# Patient Record
Sex: Female | Born: 1965 | Hispanic: No | Marital: Married | Smoking: Never smoker
Health system: Southern US, Community
[De-identification: ages and names within clinical notes are randomized; demographics above are authoritative.]

## PROBLEM LIST (undated history)

## (undated) DIAGNOSIS — M1990 Unspecified osteoarthritis, unspecified site: Secondary | ICD-10-CM

## (undated) DIAGNOSIS — G43909 Migraine, unspecified, not intractable, without status migrainosus: Secondary | ICD-10-CM

## (undated) DIAGNOSIS — J309 Allergic rhinitis, unspecified: Secondary | ICD-10-CM

## (undated) DIAGNOSIS — K219 Gastro-esophageal reflux disease without esophagitis: Secondary | ICD-10-CM

## (undated) HISTORY — DX: Migraine, unspecified, not intractable, without status migrainosus: G43.909

## (undated) HISTORY — DX: Allergic rhinitis, unspecified: J30.9

## (undated) HISTORY — DX: Gastro-esophageal reflux disease without esophagitis: K21.9

## (undated) HISTORY — DX: Unspecified osteoarthritis, unspecified site: M19.90

---

## 1998-01-24 ENCOUNTER — Other Ambulatory Visit: Admission: RE | Admit: 1998-01-24 | Discharge: 1998-01-24 | Payer: Self-pay | Admitting: Gynecology

## 1999-02-18 ENCOUNTER — Other Ambulatory Visit: Admission: RE | Admit: 1999-02-18 | Discharge: 1999-02-18 | Payer: Self-pay | Admitting: Gynecology

## 2000-04-22 ENCOUNTER — Other Ambulatory Visit: Admission: RE | Admit: 2000-04-22 | Discharge: 2000-04-22 | Payer: Self-pay | Admitting: Gynecology

## 2001-05-10 ENCOUNTER — Other Ambulatory Visit: Admission: RE | Admit: 2001-05-10 | Discharge: 2001-05-10 | Payer: Self-pay | Admitting: Gynecology

## 2002-07-06 ENCOUNTER — Other Ambulatory Visit: Admission: RE | Admit: 2002-07-06 | Discharge: 2002-07-06 | Payer: Self-pay | Admitting: Gynecology

## 2003-07-10 ENCOUNTER — Other Ambulatory Visit: Admission: RE | Admit: 2003-07-10 | Discharge: 2003-07-10 | Payer: Self-pay | Admitting: Gynecology

## 2004-08-12 ENCOUNTER — Other Ambulatory Visit: Admission: RE | Admit: 2004-08-12 | Discharge: 2004-08-12 | Payer: Self-pay | Admitting: Gynecology

## 2005-08-19 ENCOUNTER — Other Ambulatory Visit: Admission: RE | Admit: 2005-08-19 | Discharge: 2005-08-19 | Payer: Self-pay | Admitting: Gynecology

## 2011-05-13 HISTORY — PX: CHOLECYSTECTOMY: SHX55

## 2015-10-02 ENCOUNTER — Ambulatory Visit (INDEPENDENT_AMBULATORY_CARE_PROVIDER_SITE_OTHER): Payer: BLUE CROSS/BLUE SHIELD | Admitting: Sports Medicine

## 2015-10-02 ENCOUNTER — Ambulatory Visit (INDEPENDENT_AMBULATORY_CARE_PROVIDER_SITE_OTHER): Payer: BLUE CROSS/BLUE SHIELD

## 2015-10-02 ENCOUNTER — Encounter: Payer: Self-pay | Admitting: Sports Medicine

## 2015-10-02 DIAGNOSIS — M79673 Pain in unspecified foot: Secondary | ICD-10-CM

## 2015-10-02 DIAGNOSIS — R252 Cramp and spasm: Secondary | ICD-10-CM

## 2015-10-02 DIAGNOSIS — M722 Plantar fascial fibromatosis: Secondary | ICD-10-CM

## 2015-10-02 NOTE — Progress Notes (Signed)
Patient ID: Cathy Whitaker, female   DOB: 28-Jul-1966, 49 y.o.   MRN: [id number] Subjective: Cathy Whitaker is a 49 y.o. female patient presents to office with complaint of heel pain on the right and occasional night spams on left. Patient admits to post static dyskinesia for 1.5  months in duration with burning to toes; ranks 4/10 sometimes. Patient has treated this problem with topical cream and night splint of her husband which helps.  Patient reports that she also sometimes have cramps in foot on left at night that wakes up her husband and lasts for a few minutes made better with massage or walking. Denies any other pedal complaints.   Review of Systems  All other systems reviewed and are negative.  There are no active problems to display for this patient.  No current outpatient prescriptions on file prior to visit.   No current facility-administered medications on file prior to visit.   No Known Allergies   Objective: Physical Exam General: The patient is alert and oriented x3 in no acute distress.  Dermatology: Skin is warm, dry and supple bilateral lower extremities. Nails 1-10 are normal. There is no erythema, edema, no eccymosis, no open lesions present. Integument is otherwise unremarkable.  Vascular: Dorsalis Pedis pulse and Posterior Tibial pulse are 2/4 bilateral. Capillary fill time is immediate to all digits.  Neurological: Grossly intact to light touch with an achilles reflex of +2/5 and a negative Tinel's sign bilateral.  Musculoskeletal: Mild Tenderness to palpation at the medial calcaneal tubercale and through the insertion of the plantar fascia on the right foot. No pain with palpation to left. No active cramping or spasms. No pain with compression of calcaneus bilateral. No pain with tuning fork to calcaneus bilateral. No pain with calf compression bilateral. Ankle and pedal range of motion within normal limits bilateral. Strength 5/5 in all groups bilateral. Cavus foot  type.   Xray, Right & Left foot: 3 Views Normal osseous mineralization. Joint spaces preserved. No fracture/dislocation/boney destruction. Mild 4-5 hammertoe, Minimal Calcaneal spur present with mild thickening of plantar fascia on right, none on left. Pes Cavus foot type. No other soft tissue abnormalities or radiopaque foreign bodies.   Assessment and Plan: Problem List Items Addressed This Visit    None    Visit Diagnoses    Foot pain, unspecified laterality    -  Primary    Relevant Orders    DG Foot Complete Left    DG Foot Complete Right    Plantar fasciitis of right foot        Nocturnal foot cramps        Left, ocassional      -Complete examination performed. Discussed with patient in detail the condition of plantar fasciitis, how this occurs and general treatment options. Explained both conservative and surgical treatments.  -No injection or medicine given at this time due to minimal symptoms on right -Recommended good supportive shoes and advised use of OTC insert. Explained to patient that if these orthoses work well, we will continue with these. If these do not improve her condition and  pain, we will consider custom molded orthoses. -Explained in detail the use of the fascial brace which was dispensed at today's visit. -Cont with night splint daily of which already owns. -Explained and dispensed to patient daily stretching exercises. -Recommend patient to ice affected area 1-2x daily. Ice pack given.  -Recommend tonic water for nocturnal cramps; will monitor closely  -Patient to return to office in  4 weeks for follow up or sooner if problems or questions. Arise.  Asencion Islamitorya Kenzee Bassin, DPM

## 2015-10-02 NOTE — Patient Instructions (Signed)

## 2015-10-02 NOTE — Progress Notes (Deleted)
   Subjective:    Patient ID: Cathy HesselbachPaula S Sjogren, female    DOB: Nov 22, 1965, 49 y.o.   MRN: [id number]  HPI    Review of Systems  All other systems reviewed and are negative.      Objective:   Physical Exam        Assessment & Plan:

## 2015-10-30 ENCOUNTER — Ambulatory Visit (INDEPENDENT_AMBULATORY_CARE_PROVIDER_SITE_OTHER): Payer: BLUE CROSS/BLUE SHIELD | Admitting: Sports Medicine

## 2015-10-30 ENCOUNTER — Encounter: Payer: Self-pay | Admitting: Sports Medicine

## 2015-10-30 DIAGNOSIS — M79673 Pain in unspecified foot: Secondary | ICD-10-CM | POA: Diagnosis not present

## 2015-10-30 DIAGNOSIS — R252 Cramp and spasm: Secondary | ICD-10-CM | POA: Diagnosis not present

## 2015-10-30 DIAGNOSIS — M722 Plantar fascial fibromatosis: Secondary | ICD-10-CM | POA: Diagnosis not present

## 2015-10-30 NOTE — Progress Notes (Signed)
Patient ID: Cathy Whitaker, female   DOB: 25-Mar-1966, 50 y.o.   MRN: [id number]  Subjective: Cathy Whitaker is a 50 y.o. female patient returns to office with for follow up of right heel pain and occasional night spams on left. Patient states that pain is completely resolved. Denies any other pedal complaints.   There are no active problems to display for this patient.  Current Outpatient Prescriptions on File Prior to Visit  Medication Sig Dispense Refill  . doxycycline (PERIOSTAT) 20 MG tablet Take 20 mg by mouth 2 (two) times daily with a meal.  5  . fexofenadine-pseudoephedrine (ALLEGRA-D 24) 180-240 MG 24 hr tablet Take 1 tablet by mouth daily.    . metroNIDAZOLE (METROCREAM) 0.75 % cream Apply topically 2 (two) times daily. to face  4  . SUMAtriptan (IMITREX) 100 MG tablet Take 100 mg by mouth as needed.  12  . valACYclovir (VALTREX) 1000 MG tablet TAKE ONE TABLET THREE TIMES DAILY FOR 7 DAYS  5   No current facility-administered medications on file prior to visit.   No Known Allergies   Objective: Physical Exam General: The patient is alert and oriented x3 in no acute distress.  Dermatology: Skin is warm, dry and supple bilateral lower extremities. Nails 1-10 are normal. There is no erythema, edema, no eccymosis, no open lesions present. Integument is otherwise unremarkable.  Vascular: Dorsalis Pedis pulse and Posterior Tibial pulse are 2/4 bilateral. Capillary fill time is immediate to all digits.  Neurological: Grossly intact to light touch with an achilles reflex of +2/5 and a negative Tinel's sign bilateral.  Musculoskeletal: No Tenderness to palpation at the medial calcaneal tubercale and through the insertion of the plantar fascia on the right foot. No pain with palpation to left. No active cramping or spasms. No pain with compression of calcaneus bilateral. No pain with tuning fork to calcaneus bilateral. No pain with calf compression bilateral. Ankle and pedal range of  motion within normal limits bilateral. Strength 5/5 in all groups bilateral. Cavus foot type.   Assessment and Plan: Problem List Items Addressed This Visit    None    Visit Diagnoses    Foot pain, unspecified laterality    -  Primary    Plantar fasciitis of right foot        Improved    Nocturnal foot cramps        Ocassional      -Complete examination performed.  -Discussed long term plan of care -Recommended cont with  good supportive shoes and advised use of OTC insert. Will consider custom orthotics at next encounter. -Patient to wean from fascial brace and night splint -Cont with stretching and icing as needed especially after exercise/activitiy  -Cont monitoring leg cramps -Patient to return to office in 4-6 weeks for follow up or sooner if problems or questions arise.  Asencion Islam, DPM

## 2015-12-11 ENCOUNTER — Ambulatory Visit: Payer: BLUE CROSS/BLUE SHIELD | Admitting: Sports Medicine

## 2016-01-08 ENCOUNTER — Ambulatory Visit (INDEPENDENT_AMBULATORY_CARE_PROVIDER_SITE_OTHER): Payer: BLUE CROSS/BLUE SHIELD | Admitting: Sports Medicine

## 2016-01-08 ENCOUNTER — Encounter: Payer: Self-pay | Admitting: Sports Medicine

## 2016-01-08 DIAGNOSIS — M79673 Pain in unspecified foot: Secondary | ICD-10-CM

## 2016-01-08 DIAGNOSIS — R252 Cramp and spasm: Secondary | ICD-10-CM

## 2016-01-08 DIAGNOSIS — M7741 Metatarsalgia, right foot: Secondary | ICD-10-CM | POA: Diagnosis not present

## 2016-01-08 DIAGNOSIS — M722 Plantar fascial fibromatosis: Secondary | ICD-10-CM | POA: Diagnosis not present

## 2016-01-08 NOTE — Progress Notes (Signed)
Patient ID: Cathy Whitaker, female   DOB: 1965/10/13, 50 y.o.   MRN: [id number]  Subjective: Cathy Whitaker is a 50 y.o. female patient returns to office with for follow up of right heel pain; states that she is doing well with no re-exacerbation in symptoms; every now and then she has a little pain with first few steps out of bed in the morning but has been doing stretches to help prevent the pain from worsening, Patient states that leg cramps are better; maybe 1 episode in the last 3 months; patient sates that she has a new pain at the ball of her right foot that feels like a burning sensation with thickening of the skin at site; relieved with use of silicone pad. Denies any other pedal complaints.   There are no active problems to display for this patient.  Current Outpatient Prescriptions on File Prior to Visit  Medication Sig Dispense Refill  . doxycycline (PERIOSTAT) 20 MG tablet Take 20 mg by mouth 2 (two) times daily with a meal.  5  . fexofenadine-pseudoephedrine (ALLEGRA-D 24) 180-240 MG 24 hr tablet Take 1 tablet by mouth daily.    . metroNIDAZOLE (METROCREAM) 0.75 % cream Apply topically 2 (two) times daily. to face  4  . SUMAtriptan (IMITREX) 100 MG tablet Take 100 mg by mouth as needed.  12  . valACYclovir (VALTREX) 1000 MG tablet TAKE ONE TABLET THREE TIMES DAILY FOR 7 DAYS  5   No current facility-administered medications on file prior to visit.   No Known Allergies   Objective: Physical Exam General: The patient is alert and oriented x3 in no acute distress.  Dermatology: Skin is warm, dry and supple bilateral lower extremities. Nails 1-10 are normal. There is no erythema, edema, no eccymosis, no open lesions present. No hyperkeratotic tissue. Integument is otherwise unremarkable.  Vascular: Dorsalis Pedis pulse and Posterior Tibial pulse are 2/4 bilateral. Capillary fill time is immediate to all digits.  Neurological: Grossly intact to light touch with an achilles reflex  of +2/5 and a negative Tinel's sign bilateral.  Musculoskeletal: No reproducible pain at the ball of the right foot with palpation. No Tenderness to palpation at the medial calcaneal tubercale and through the insertion of the plantar fascia on the right foot. No pain with palpation to left. No active cramping or spasms. No pain with compression of calcaneus bilateral. No pain with calf compression bilateral. Ankle and pedal range of motion within normal limits bilateral. Strength 5/5 in all groups bilateral. Cavus foot type.   Assessment and Plan: Problem List Items Addressed This Visit    None    Visit Diagnoses    Plantar fasciitis of right foot    -  Primary    Metatarsalgia of right foot        Foot pain, unspecified laterality        Nocturnal foot cramps        Ocassional      -Complete examination performed.  -Discussed long term plan of care for plantar fasciitis and metatarsalgia in relations to her foot type -Recommended cont with  good supportive shoes  -Recommended custom functional foot orthotics; scanned patient today for full length orthotic with met pad acommodation with deep heel cup no post and pearl cow top cover.  -Gave patient felt met pad to use at ball of right foot and instructed patient on use. Patient may also use the silicone one that she has until her custom orthotics arrive -Cont with  stretching and icing as needed especially after exercise/activitiy to prevent worsening or recurrence of heel pain. Advised patient if returns to ice, wear fascial brace, and return to office for eval -Cont monitoring leg cramps if worsen will further discuss treatment options  -Patient to return to office for pick up orthotics or sooner if problems or questions arise.  Landis Martins, DPM

## 2016-02-19 ENCOUNTER — Ambulatory Visit: Payer: BLUE CROSS/BLUE SHIELD | Admitting: Sports Medicine

## 2016-02-19 DIAGNOSIS — M79673 Pain in unspecified foot: Secondary | ICD-10-CM

## 2016-04-01 ENCOUNTER — Ambulatory Visit: Payer: BLUE CROSS/BLUE SHIELD | Admitting: Sports Medicine

## 2016-04-02 NOTE — Patient Instructions (Signed)

## 2016-04-02 NOTE — Progress Notes (Signed)
Patient ID: Cathy Whitaker, female   DOB: July 17, 1966, 50 y.o.   MRN: [id number] [facility]  Verbal and written break in and wear instructions given.  Patient will follow up in 4 weeks if symptoms worsen or fail to improve.

## 2016-06-10 DIAGNOSIS — Z6820 Body mass index (BMI) 20.0-20.9, adult: Secondary | ICD-10-CM | POA: Diagnosis not present

## 2016-06-10 DIAGNOSIS — Z01419 Encounter for gynecological examination (general) (routine) without abnormal findings: Secondary | ICD-10-CM | POA: Diagnosis not present

## 2016-06-10 DIAGNOSIS — Z1231 Encounter for screening mammogram for malignant neoplasm of breast: Secondary | ICD-10-CM | POA: Diagnosis not present

## 2016-07-02 DIAGNOSIS — G43909 Migraine, unspecified, not intractable, without status migrainosus: Secondary | ICD-10-CM | POA: Diagnosis not present

## 2016-07-02 DIAGNOSIS — Z1389 Encounter for screening for other disorder: Secondary | ICD-10-CM | POA: Diagnosis not present

## 2016-07-03 DIAGNOSIS — Z0000 Encounter for general adult medical examination without abnormal findings: Secondary | ICD-10-CM | POA: Diagnosis not present

## 2016-07-17 DIAGNOSIS — B009 Herpesviral infection, unspecified: Secondary | ICD-10-CM | POA: Diagnosis not present

## 2016-07-17 DIAGNOSIS — D225 Melanocytic nevi of trunk: Secondary | ICD-10-CM | POA: Diagnosis not present

## 2016-07-17 DIAGNOSIS — D2239 Melanocytic nevi of other parts of face: Secondary | ICD-10-CM | POA: Diagnosis not present

## 2016-10-01 DIAGNOSIS — J0100 Acute maxillary sinusitis, unspecified: Secondary | ICD-10-CM | POA: Diagnosis not present

## 2016-11-02 DIAGNOSIS — D125 Benign neoplasm of sigmoid colon: Secondary | ICD-10-CM | POA: Diagnosis not present

## 2016-11-02 DIAGNOSIS — Z1211 Encounter for screening for malignant neoplasm of colon: Secondary | ICD-10-CM | POA: Diagnosis not present

## 2016-11-12 DIAGNOSIS — F329 Major depressive disorder, single episode, unspecified: Secondary | ICD-10-CM | POA: Diagnosis not present

## 2017-06-24 DIAGNOSIS — Z124 Encounter for screening for malignant neoplasm of cervix: Secondary | ICD-10-CM | POA: Diagnosis not present

## 2017-06-24 DIAGNOSIS — Z01419 Encounter for gynecological examination (general) (routine) without abnormal findings: Secondary | ICD-10-CM | POA: Diagnosis not present

## 2017-06-24 DIAGNOSIS — Z1231 Encounter for screening mammogram for malignant neoplasm of breast: Secondary | ICD-10-CM | POA: Diagnosis not present

## 2017-07-12 DIAGNOSIS — D225 Melanocytic nevi of trunk: Secondary | ICD-10-CM | POA: Diagnosis not present

## 2017-07-12 DIAGNOSIS — L821 Other seborrheic keratosis: Secondary | ICD-10-CM | POA: Diagnosis not present

## 2017-07-12 DIAGNOSIS — D2239 Melanocytic nevi of other parts of face: Secondary | ICD-10-CM | POA: Diagnosis not present

## 2017-07-12 DIAGNOSIS — L814 Other melanin hyperpigmentation: Secondary | ICD-10-CM | POA: Diagnosis not present

## 2017-07-12 DIAGNOSIS — D485 Neoplasm of uncertain behavior of skin: Secondary | ICD-10-CM | POA: Diagnosis not present

## 2017-11-05 DIAGNOSIS — M545 Low back pain: Secondary | ICD-10-CM | POA: Diagnosis not present

## 2017-11-05 DIAGNOSIS — M419 Scoliosis, unspecified: Secondary | ICD-10-CM | POA: Diagnosis not present

## 2017-11-05 DIAGNOSIS — Z6820 Body mass index (BMI) 20.0-20.9, adult: Secondary | ICD-10-CM | POA: Diagnosis not present

## 2017-12-02 DIAGNOSIS — G43909 Migraine, unspecified, not intractable, without status migrainosus: Secondary | ICD-10-CM | POA: Diagnosis not present

## 2017-12-02 DIAGNOSIS — Z681 Body mass index (BMI) 19 or less, adult: Secondary | ICD-10-CM | POA: Diagnosis not present

## 2018-01-10 DIAGNOSIS — L601 Onycholysis: Secondary | ICD-10-CM | POA: Diagnosis not present

## 2018-02-21 DIAGNOSIS — L601 Onycholysis: Secondary | ICD-10-CM | POA: Diagnosis not present

## 2018-03-17 DIAGNOSIS — H1013 Acute atopic conjunctivitis, bilateral: Secondary | ICD-10-CM | POA: Diagnosis not present

## 2018-03-17 DIAGNOSIS — J309 Allergic rhinitis, unspecified: Secondary | ICD-10-CM | POA: Diagnosis not present

## 2018-03-17 DIAGNOSIS — Z681 Body mass index (BMI) 19 or less, adult: Secondary | ICD-10-CM | POA: Diagnosis not present

## 2018-04-18 DIAGNOSIS — L601 Onycholysis: Secondary | ICD-10-CM | POA: Diagnosis not present

## 2018-06-20 DIAGNOSIS — J309 Allergic rhinitis, unspecified: Secondary | ICD-10-CM | POA: Diagnosis not present

## 2018-06-20 DIAGNOSIS — H6980 Other specified disorders of Eustachian tube, unspecified ear: Secondary | ICD-10-CM | POA: Diagnosis not present

## 2018-06-20 DIAGNOSIS — Z681 Body mass index (BMI) 19 or less, adult: Secondary | ICD-10-CM | POA: Diagnosis not present

## 2018-06-27 DIAGNOSIS — Z124 Encounter for screening for malignant neoplasm of cervix: Secondary | ICD-10-CM | POA: Diagnosis not present

## 2018-06-27 DIAGNOSIS — Z1231 Encounter for screening mammogram for malignant neoplasm of breast: Secondary | ICD-10-CM | POA: Diagnosis not present

## 2018-06-27 DIAGNOSIS — Z01419 Encounter for gynecological examination (general) (routine) without abnormal findings: Secondary | ICD-10-CM | POA: Diagnosis not present

## 2018-06-27 DIAGNOSIS — Z681 Body mass index (BMI) 19 or less, adult: Secondary | ICD-10-CM | POA: Diagnosis not present

## 2018-06-30 DIAGNOSIS — H9120 Sudden idiopathic hearing loss, unspecified ear: Secondary | ICD-10-CM | POA: Diagnosis not present

## 2018-06-30 DIAGNOSIS — H9191 Unspecified hearing loss, right ear: Secondary | ICD-10-CM | POA: Diagnosis not present

## 2018-06-30 DIAGNOSIS — H9042 Sensorineural hearing loss, unilateral, left ear, with unrestricted hearing on the contralateral side: Secondary | ICD-10-CM | POA: Diagnosis not present

## 2018-07-05 ENCOUNTER — Other Ambulatory Visit: Payer: Self-pay | Admitting: Physician Assistant

## 2018-07-05 DIAGNOSIS — H9120 Sudden idiopathic hearing loss, unspecified ear: Secondary | ICD-10-CM

## 2018-07-10 ENCOUNTER — Ambulatory Visit
Admission: RE | Admit: 2018-07-10 | Discharge: 2018-07-10 | Disposition: A | Discharge status: Home / Self Care | Payer: BLUE CROSS/BLUE SHIELD | Source: Ambulatory Visit | Attending: Physician Assistant | Admitting: Physician Assistant

## 2018-07-10 DIAGNOSIS — H9123 Sudden idiopathic hearing loss, bilateral: Secondary | ICD-10-CM | POA: Diagnosis not present

## 2018-07-10 DIAGNOSIS — H9120 Sudden idiopathic hearing loss, unspecified ear: Secondary | ICD-10-CM

## 2018-07-10 MED ORDER — GADOBENATE DIMEGLUMINE 529 MG/ML IV SOLN
10.0000 mL | Freq: Once | INTRAVENOUS | Status: AC | PRN
  Administered 2018-07-10: 10 mL via INTRAVENOUS

## 2018-07-12 DIAGNOSIS — L820 Inflamed seborrheic keratosis: Secondary | ICD-10-CM | POA: Diagnosis not present

## 2018-07-12 DIAGNOSIS — D225 Melanocytic nevi of trunk: Secondary | ICD-10-CM | POA: Diagnosis not present

## 2018-07-12 DIAGNOSIS — L821 Other seborrheic keratosis: Secondary | ICD-10-CM | POA: Diagnosis not present

## 2018-07-12 DIAGNOSIS — D2239 Melanocytic nevi of other parts of face: Secondary | ICD-10-CM | POA: Diagnosis not present

## 2018-07-12 DIAGNOSIS — B351 Tinea unguium: Secondary | ICD-10-CM | POA: Diagnosis not present

## 2018-07-14 DIAGNOSIS — H905 Unspecified sensorineural hearing loss: Secondary | ICD-10-CM | POA: Diagnosis not present

## 2018-07-14 DIAGNOSIS — H9122 Sudden idiopathic hearing loss, left ear: Secondary | ICD-10-CM | POA: Diagnosis not present

## 2018-09-07 DIAGNOSIS — Z681 Body mass index (BMI) 19 or less, adult: Secondary | ICD-10-CM | POA: Diagnosis not present

## 2018-09-07 DIAGNOSIS — H10509 Unspecified blepharoconjunctivitis, unspecified eye: Secondary | ICD-10-CM | POA: Diagnosis not present

## 2018-09-18 DIAGNOSIS — K529 Noninfective gastroenteritis and colitis, unspecified: Secondary | ICD-10-CM | POA: Diagnosis not present

## 2018-09-18 DIAGNOSIS — J069 Acute upper respiratory infection, unspecified: Secondary | ICD-10-CM | POA: Diagnosis not present

## 2018-10-10 DIAGNOSIS — B351 Tinea unguium: Secondary | ICD-10-CM | POA: Diagnosis not present

## 2018-10-10 DIAGNOSIS — B009 Herpesviral infection, unspecified: Secondary | ICD-10-CM | POA: Diagnosis not present

## 2018-10-21 IMAGING — MR MR HEAD WO/W CM
11 of 12 series · 43 of 48 positions shown · IV contrast (multihance)
Comparison: None.

CLINICAL DATA: Hearing loss on the left, 3-4 weeks duration. Sudden
sensorineural hearing loss.

EXAM:
MRI HEAD WITHOUT AND WITH CONTRAST
TECHNIQUE: Multiplanar, multiecho pulse sequences of the brain and surrounding
structures were obtained without and with intravenous contrast.
CONTRAST:  10mL MULTIHANCE GADOBENATE DIMEGLUMINE 529 MG/ML IV SOLN

[Series 5: T1 · sagittal · 4.0mm · 0.72mm/px · 3 of 27 slices shown (1 of 3)]
[im 1/27]
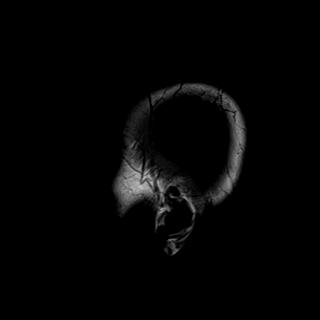
[im 14/27]
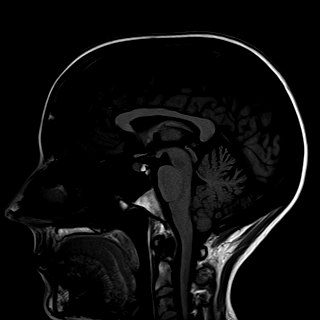
[im 27/27]
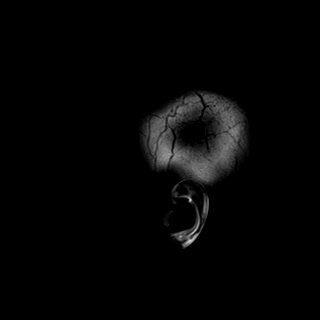

[Series 6: DWI · axial · 3.0mm · 1.44mm/px · z∈[-100,+63]mm · 8 of 85 slices shown (1 of 2)]
[im 1/85]
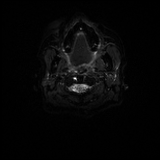
[im 13/85]
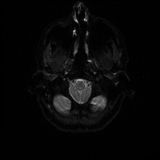
[im 25/85]
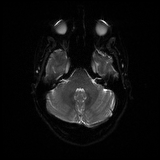
[im 37/85]
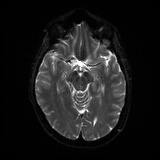
[im 49/85]
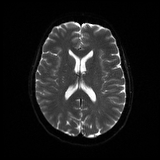
[im 61/85]
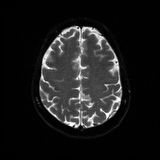
[im 73/85]
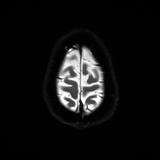
[im 85/85]
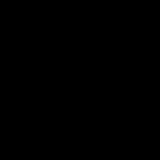

[Series 7: DWI · axial · 3.0mm · 1.44mm/px · z∈[-100,+59]mm · 3 of 42 slices shown (2 of 2)]
[im 1/42]
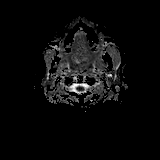
[im 21/42]
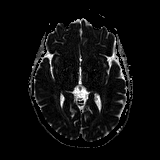
[im 42/42]
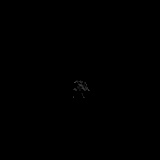

[Series 8: T2 · axial · 4.0mm · 0.36mm/px · z∈[-87,+47]mm · 2 of 27 slices shown]
[im 1/27]
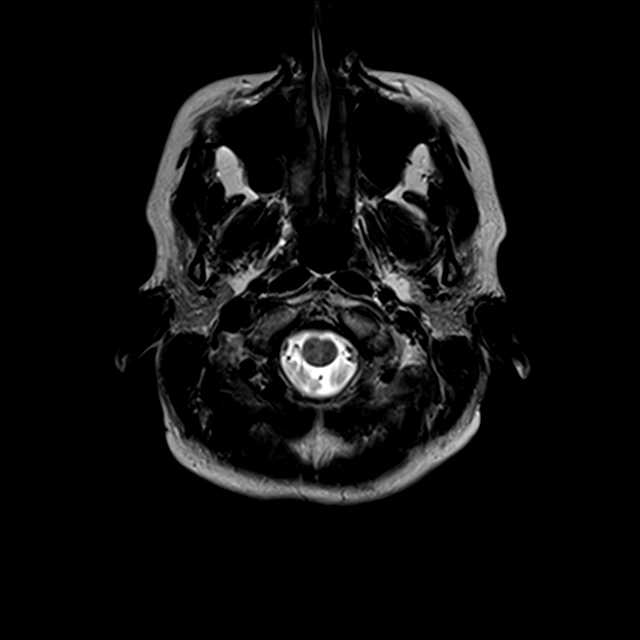
[im 27/27]
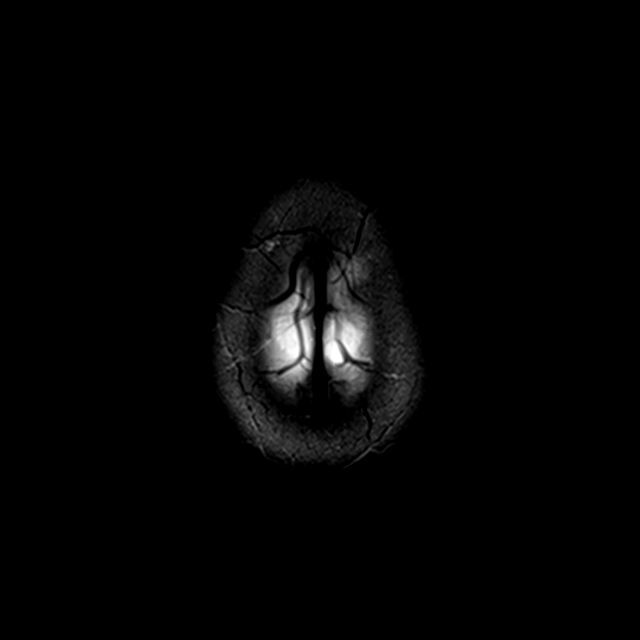

[Series 9: FLAIR · axial · 3.0mm · 0.72mm/px · z∈[-100,+61]mm · 2 of 28 slices shown]
[im 1/28]
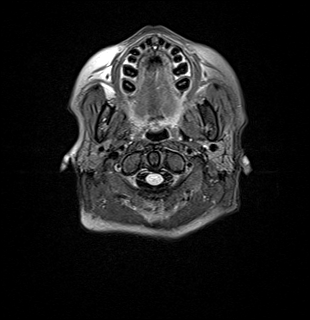
[im 28/28]
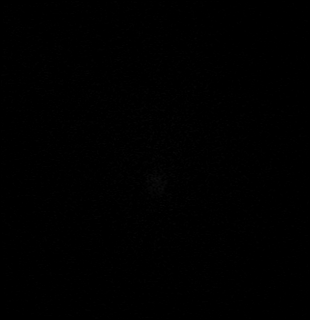

[Series 11: swi_images · axial · 1.5mm · 0.90mm/px · z∈[-86,+55]mm · 8 of 96 slices shown]
[im 1/96]
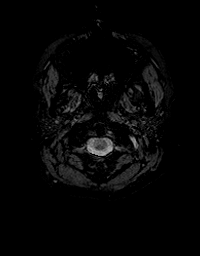
[im 14/96]
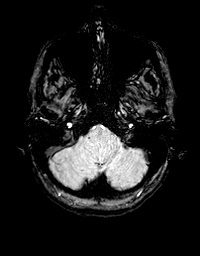
[im 28/96]
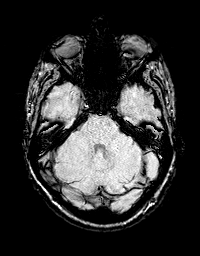
[im 41/96]
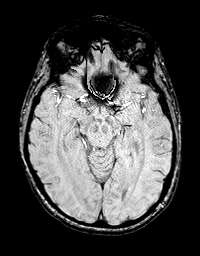
[im 55/96]
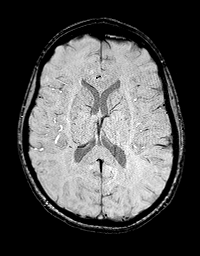
[im 68/96]
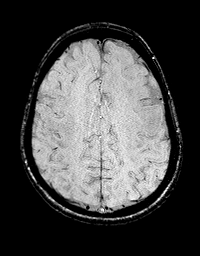
[im 82/96]
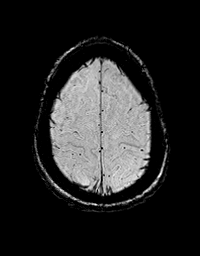
[im 96/96]
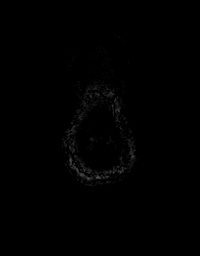

[Series 12: T1 · coronal · 2.5mm · 0.56mm/px · 1 of 13 slices shown (2 of 3)]
[im 1/13]
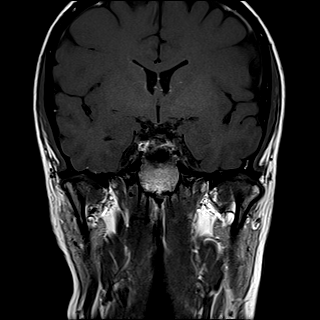

[Series 13: T1 · axial · 2.5mm · 0.50mm/px · 1 of 13 slices shown (3 of 3)]
[im 1/13]
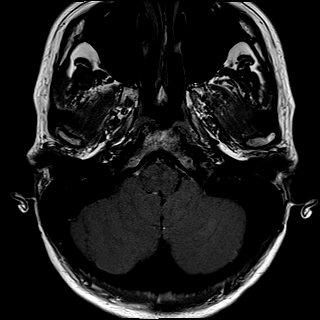

[Series 15: T1 post-contrast · coronal · 2.5mm · 0.56mm/px · 1 of 13 slices shown (1 of 3)]
[im 1/13]
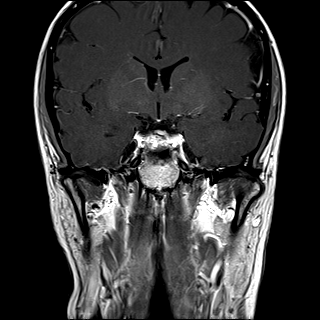

[Series 16: T1 post-contrast · axial · 2.5mm · 0.50mm/px · 1 of 13 slices shown (2 of 3)]
[im 1/13]
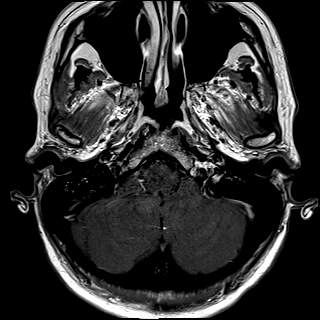

[Series 17: T1 post-contrast · axial · 1.0mm · 0.90mm/px · z∈[-96,+62]mm · 13 of 160 slices shown (3 of 3)]
[im 1/160]
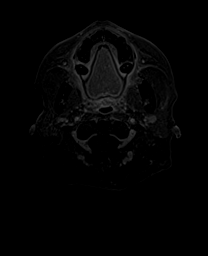
[im 14/160]
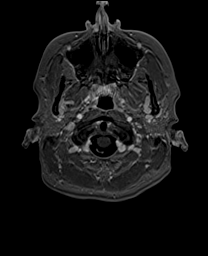
[im 27/160]
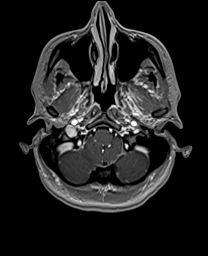
[im 40/160]
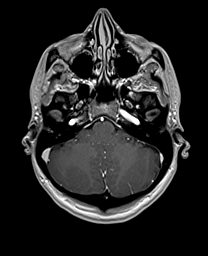
[im 54/160]
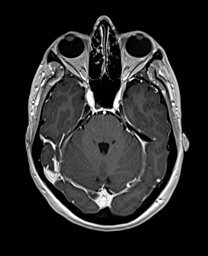
[im 67/160]
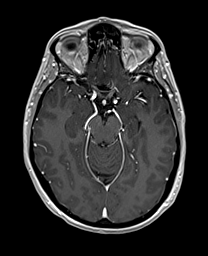
[im 80/160]
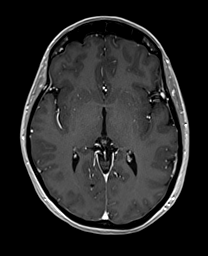
[im 93/160]
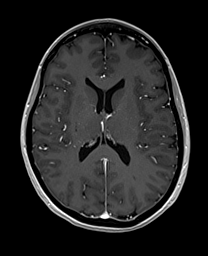
[im 107/160]
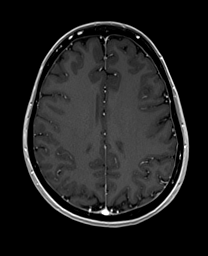
[im 120/160]
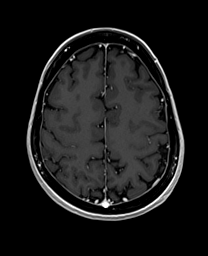
[im 133/160]
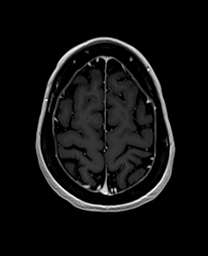
[im 146/160]
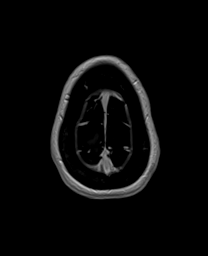
[im 160/160]
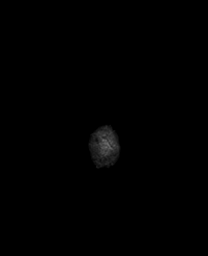

[43 of 48 positions shown; findings below may reference images not displayed]

FINDINGS: Brain: The brain has a normal appearance without evidence of
malformation, atrophy, old or acute small or large vessel
infarction, mass lesion, hemorrhage, hydrocephalus or extra-axial
collection. CP angle regions are normal. No vestibular schwannoma.
No enhancing neuritis. Abnormal enhancement of the inner ear
structures. No abnormal intracranial enhancement elsewhere.

Vascular: Major vessels at the base of the brain show flow. Venous
sinuses appear patent.

Skull and upper cervical spine: Normal.

Sinuses/Orbits: Clear/normal.

Other: None significant.
IMPRESSION: Normal examination. No abnormality seen to explain sudden
sensorineural hearing loss on the left.

## 2018-11-19 DIAGNOSIS — J00 Acute nasopharyngitis [common cold]: Secondary | ICD-10-CM | POA: Diagnosis not present

## 2018-11-27 DIAGNOSIS — J069 Acute upper respiratory infection, unspecified: Secondary | ICD-10-CM | POA: Diagnosis not present

## 2019-03-22 DIAGNOSIS — Z1331 Encounter for screening for depression: Secondary | ICD-10-CM | POA: Diagnosis not present

## 2019-03-22 DIAGNOSIS — N898 Other specified noninflammatory disorders of vagina: Secondary | ICD-10-CM | POA: Diagnosis not present

## 2019-03-22 DIAGNOSIS — Z0000 Encounter for general adult medical examination without abnormal findings: Secondary | ICD-10-CM | POA: Diagnosis not present

## 2019-03-22 DIAGNOSIS — Z13220 Encounter for screening for lipoid disorders: Secondary | ICD-10-CM | POA: Diagnosis not present

## 2019-03-22 DIAGNOSIS — R4586 Emotional lability: Secondary | ICD-10-CM | POA: Diagnosis not present

## 2019-06-21 DIAGNOSIS — N952 Postmenopausal atrophic vaginitis: Secondary | ICD-10-CM | POA: Diagnosis not present

## 2019-06-21 DIAGNOSIS — N393 Stress incontinence (female) (male): Secondary | ICD-10-CM | POA: Diagnosis not present

## 2019-06-21 DIAGNOSIS — R5383 Other fatigue: Secondary | ICD-10-CM | POA: Diagnosis not present

## 2019-06-21 DIAGNOSIS — N951 Menopausal and female climacteric states: Secondary | ICD-10-CM | POA: Diagnosis not present

## 2019-06-21 DIAGNOSIS — Z79899 Other long term (current) drug therapy: Secondary | ICD-10-CM | POA: Diagnosis not present

## 2019-07-04 DIAGNOSIS — Z124 Encounter for screening for malignant neoplasm of cervix: Secondary | ICD-10-CM | POA: Diagnosis not present

## 2019-07-04 DIAGNOSIS — Z681 Body mass index (BMI) 19 or less, adult: Secondary | ICD-10-CM | POA: Diagnosis not present

## 2019-07-04 DIAGNOSIS — Z01419 Encounter for gynecological examination (general) (routine) without abnormal findings: Secondary | ICD-10-CM | POA: Diagnosis not present

## 2019-07-05 DIAGNOSIS — N951 Menopausal and female climacteric states: Secondary | ICD-10-CM | POA: Diagnosis not present

## 2019-07-05 DIAGNOSIS — N952 Postmenopausal atrophic vaginitis: Secondary | ICD-10-CM | POA: Diagnosis not present

## 2019-08-04 DIAGNOSIS — Z1231 Encounter for screening mammogram for malignant neoplasm of breast: Secondary | ICD-10-CM | POA: Diagnosis not present

## 2019-09-08 DIAGNOSIS — M62830 Muscle spasm of back: Secondary | ICD-10-CM | POA: Diagnosis not present

## 2019-10-04 DIAGNOSIS — N951 Menopausal and female climacteric states: Secondary | ICD-10-CM | POA: Diagnosis not present

## 2019-10-04 DIAGNOSIS — N952 Postmenopausal atrophic vaginitis: Secondary | ICD-10-CM | POA: Diagnosis not present

## 2019-10-15 DIAGNOSIS — R509 Fever, unspecified: Secondary | ICD-10-CM | POA: Diagnosis not present

## 2019-10-15 DIAGNOSIS — Z20822 Contact with and (suspected) exposure to covid-19: Secondary | ICD-10-CM | POA: Diagnosis not present

## 2019-11-06 DIAGNOSIS — Z20828 Contact with and (suspected) exposure to other viral communicable diseases: Secondary | ICD-10-CM | POA: Diagnosis not present

## 2019-11-06 DIAGNOSIS — J329 Chronic sinusitis, unspecified: Secondary | ICD-10-CM | POA: Diagnosis not present

## 2019-11-06 DIAGNOSIS — R0989 Other specified symptoms and signs involving the circulatory and respiratory systems: Secondary | ICD-10-CM | POA: Diagnosis not present

## 2019-11-06 DIAGNOSIS — Z6820 Body mass index (BMI) 20.0-20.9, adult: Secondary | ICD-10-CM | POA: Diagnosis not present

## 2019-11-21 DIAGNOSIS — J329 Chronic sinusitis, unspecified: Secondary | ICD-10-CM | POA: Diagnosis not present

## 2019-11-21 DIAGNOSIS — J45991 Cough variant asthma: Secondary | ICD-10-CM | POA: Diagnosis not present

## 2019-11-21 DIAGNOSIS — H6980 Other specified disorders of Eustachian tube, unspecified ear: Secondary | ICD-10-CM | POA: Diagnosis not present

## 2019-11-21 DIAGNOSIS — J40 Bronchitis, not specified as acute or chronic: Secondary | ICD-10-CM | POA: Diagnosis not present

## 2019-12-05 DIAGNOSIS — J309 Allergic rhinitis, unspecified: Secondary | ICD-10-CM | POA: Diagnosis not present

## 2019-12-05 DIAGNOSIS — J40 Bronchitis, not specified as acute or chronic: Secondary | ICD-10-CM | POA: Diagnosis not present

## 2019-12-05 DIAGNOSIS — H6980 Other specified disorders of Eustachian tube, unspecified ear: Secondary | ICD-10-CM | POA: Diagnosis not present

## 2019-12-05 DIAGNOSIS — J329 Chronic sinusitis, unspecified: Secondary | ICD-10-CM | POA: Diagnosis not present

## 2019-12-16 DIAGNOSIS — N3001 Acute cystitis with hematuria: Secondary | ICD-10-CM | POA: Diagnosis not present

## 2019-12-16 DIAGNOSIS — N10 Acute tubulo-interstitial nephritis: Secondary | ICD-10-CM | POA: Diagnosis not present

## 2020-03-19 DIAGNOSIS — H6983 Other specified disorders of Eustachian tube, bilateral: Secondary | ICD-10-CM | POA: Diagnosis not present

## 2020-03-19 DIAGNOSIS — J302 Other seasonal allergic rhinitis: Secondary | ICD-10-CM | POA: Diagnosis not present

## 2020-03-19 DIAGNOSIS — H8113 Benign paroxysmal vertigo, bilateral: Secondary | ICD-10-CM | POA: Diagnosis not present

## 2020-03-19 DIAGNOSIS — Z681 Body mass index (BMI) 19 or less, adult: Secondary | ICD-10-CM | POA: Diagnosis not present

## 2020-04-09 DIAGNOSIS — M26629 Arthralgia of temporomandibular joint, unspecified side: Secondary | ICD-10-CM | POA: Diagnosis not present

## 2020-04-09 DIAGNOSIS — Z131 Encounter for screening for diabetes mellitus: Secondary | ICD-10-CM | POA: Diagnosis not present

## 2020-04-09 DIAGNOSIS — K2100 Gastro-esophageal reflux disease with esophagitis, without bleeding: Secondary | ICD-10-CM | POA: Diagnosis not present

## 2020-04-09 DIAGNOSIS — Z01419 Encounter for gynecological examination (general) (routine) without abnormal findings: Secondary | ICD-10-CM | POA: Diagnosis not present

## 2020-04-09 DIAGNOSIS — Z13220 Encounter for screening for lipoid disorders: Secondary | ICD-10-CM | POA: Diagnosis not present

## 2020-04-09 DIAGNOSIS — J309 Allergic rhinitis, unspecified: Secondary | ICD-10-CM | POA: Diagnosis not present

## 2020-04-18 DIAGNOSIS — N951 Menopausal and female climacteric states: Secondary | ICD-10-CM | POA: Diagnosis not present

## 2020-04-18 DIAGNOSIS — N952 Postmenopausal atrophic vaginitis: Secondary | ICD-10-CM | POA: Diagnosis not present

## 2020-05-21 DIAGNOSIS — L821 Other seborrheic keratosis: Secondary | ICD-10-CM | POA: Diagnosis not present

## 2020-05-21 DIAGNOSIS — D225 Melanocytic nevi of trunk: Secondary | ICD-10-CM | POA: Diagnosis not present

## 2020-05-21 DIAGNOSIS — L270 Generalized skin eruption due to drugs and medicaments taken internally: Secondary | ICD-10-CM | POA: Diagnosis not present

## 2020-05-21 DIAGNOSIS — D485 Neoplasm of uncertain behavior of skin: Secondary | ICD-10-CM | POA: Diagnosis not present

## 2020-05-21 DIAGNOSIS — D2239 Melanocytic nevi of other parts of face: Secondary | ICD-10-CM | POA: Diagnosis not present

## 2020-05-30 DIAGNOSIS — R7401 Elevation of levels of liver transaminase levels: Secondary | ICD-10-CM | POA: Diagnosis not present

## 2020-05-30 DIAGNOSIS — Z681 Body mass index (BMI) 19 or less, adult: Secondary | ICD-10-CM | POA: Diagnosis not present

## 2020-05-30 DIAGNOSIS — H6983 Other specified disorders of Eustachian tube, bilateral: Secondary | ICD-10-CM | POA: Diagnosis not present

## 2020-07-06 DIAGNOSIS — R0981 Nasal congestion: Secondary | ICD-10-CM | POA: Diagnosis not present

## 2020-07-06 DIAGNOSIS — Z20828 Contact with and (suspected) exposure to other viral communicable diseases: Secondary | ICD-10-CM | POA: Diagnosis not present

## 2020-07-06 DIAGNOSIS — J0100 Acute maxillary sinusitis, unspecified: Secondary | ICD-10-CM | POA: Diagnosis not present

## 2020-07-16 DIAGNOSIS — R7401 Elevation of levels of liver transaminase levels: Secondary | ICD-10-CM | POA: Diagnosis not present

## 2020-07-16 DIAGNOSIS — J0100 Acute maxillary sinusitis, unspecified: Secondary | ICD-10-CM | POA: Diagnosis not present

## 2020-07-16 DIAGNOSIS — Z681 Body mass index (BMI) 19 or less, adult: Secondary | ICD-10-CM | POA: Diagnosis not present

## 2020-07-16 DIAGNOSIS — H6592 Unspecified nonsuppurative otitis media, left ear: Secondary | ICD-10-CM | POA: Diagnosis not present

## 2020-07-30 DIAGNOSIS — J0100 Acute maxillary sinusitis, unspecified: Secondary | ICD-10-CM | POA: Diagnosis not present

## 2020-07-30 DIAGNOSIS — J0101 Acute recurrent maxillary sinusitis: Secondary | ICD-10-CM | POA: Diagnosis not present

## 2020-07-30 DIAGNOSIS — Z681 Body mass index (BMI) 19 or less, adult: Secondary | ICD-10-CM | POA: Diagnosis not present

## 2020-08-06 DIAGNOSIS — Z01419 Encounter for gynecological examination (general) (routine) without abnormal findings: Secondary | ICD-10-CM | POA: Diagnosis not present

## 2020-08-06 DIAGNOSIS — Z6820 Body mass index (BMI) 20.0-20.9, adult: Secondary | ICD-10-CM | POA: Diagnosis not present

## 2020-08-06 DIAGNOSIS — Z1231 Encounter for screening mammogram for malignant neoplasm of breast: Secondary | ICD-10-CM | POA: Diagnosis not present

## 2020-09-02 DIAGNOSIS — J0141 Acute recurrent pansinusitis: Secondary | ICD-10-CM | POA: Diagnosis not present

## 2020-09-02 DIAGNOSIS — J342 Deviated nasal septum: Secondary | ICD-10-CM | POA: Diagnosis not present

## 2020-09-02 DIAGNOSIS — J310 Chronic rhinitis: Secondary | ICD-10-CM | POA: Diagnosis not present

## 2020-09-02 DIAGNOSIS — J343 Hypertrophy of nasal turbinates: Secondary | ICD-10-CM | POA: Diagnosis not present

## 2020-10-09 DIAGNOSIS — J342 Deviated nasal septum: Secondary | ICD-10-CM | POA: Diagnosis not present

## 2020-10-09 DIAGNOSIS — J310 Chronic rhinitis: Secondary | ICD-10-CM | POA: Diagnosis not present

## 2020-10-09 DIAGNOSIS — J0141 Acute recurrent pansinusitis: Secondary | ICD-10-CM | POA: Diagnosis not present

## 2020-10-09 DIAGNOSIS — J343 Hypertrophy of nasal turbinates: Secondary | ICD-10-CM | POA: Diagnosis not present

## 2020-10-09 DIAGNOSIS — J321 Chronic frontal sinusitis: Secondary | ICD-10-CM | POA: Diagnosis not present

## 2020-10-21 DIAGNOSIS — N951 Menopausal and female climacteric states: Secondary | ICD-10-CM | POA: Diagnosis not present

## 2020-10-21 DIAGNOSIS — N952 Postmenopausal atrophic vaginitis: Secondary | ICD-10-CM | POA: Diagnosis not present

## 2020-10-21 DIAGNOSIS — Z79899 Other long term (current) drug therapy: Secondary | ICD-10-CM | POA: Diagnosis not present

## 2020-11-12 HISTORY — PX: NASAL SINUS SURGERY: SHX719

## 2020-11-25 DIAGNOSIS — Z01818 Encounter for other preprocedural examination: Secondary | ICD-10-CM | POA: Diagnosis not present

## 2020-11-28 DIAGNOSIS — J343 Hypertrophy of nasal turbinates: Secondary | ICD-10-CM | POA: Diagnosis not present

## 2020-11-28 DIAGNOSIS — J329 Chronic sinusitis, unspecified: Secondary | ICD-10-CM | POA: Diagnosis not present

## 2020-11-28 DIAGNOSIS — J342 Deviated nasal septum: Secondary | ICD-10-CM | POA: Diagnosis not present

## 2020-11-28 DIAGNOSIS — J322 Chronic ethmoidal sinusitis: Secondary | ICD-10-CM | POA: Diagnosis not present

## 2020-11-28 DIAGNOSIS — J324 Chronic pansinusitis: Secondary | ICD-10-CM | POA: Diagnosis not present

## 2021-04-09 DIAGNOSIS — K219 Gastro-esophageal reflux disease without esophagitis: Secondary | ICD-10-CM | POA: Diagnosis not present

## 2021-04-09 DIAGNOSIS — R1013 Epigastric pain: Secondary | ICD-10-CM | POA: Diagnosis not present

## 2021-04-09 DIAGNOSIS — Z6820 Body mass index (BMI) 20.0-20.9, adult: Secondary | ICD-10-CM | POA: Diagnosis not present

## 2021-04-21 DIAGNOSIS — Z79899 Other long term (current) drug therapy: Secondary | ICD-10-CM | POA: Diagnosis not present

## 2021-04-21 DIAGNOSIS — N952 Postmenopausal atrophic vaginitis: Secondary | ICD-10-CM | POA: Diagnosis not present

## 2021-04-21 DIAGNOSIS — N951 Menopausal and female climacteric states: Secondary | ICD-10-CM | POA: Diagnosis not present

## 2021-05-20 DIAGNOSIS — Z6820 Body mass index (BMI) 20.0-20.9, adult: Secondary | ICD-10-CM | POA: Diagnosis not present

## 2021-05-20 DIAGNOSIS — Z01419 Encounter for gynecological examination (general) (routine) without abnormal findings: Secondary | ICD-10-CM | POA: Diagnosis not present

## 2021-05-23 DIAGNOSIS — H9042 Sensorineural hearing loss, unilateral, left ear, with unrestricted hearing on the contralateral side: Secondary | ICD-10-CM | POA: Diagnosis not present

## 2021-06-27 DIAGNOSIS — J310 Chronic rhinitis: Secondary | ICD-10-CM | POA: Diagnosis not present

## 2021-06-27 DIAGNOSIS — H9042 Sensorineural hearing loss, unilateral, left ear, with unrestricted hearing on the contralateral side: Secondary | ICD-10-CM | POA: Diagnosis not present

## 2021-06-27 DIAGNOSIS — H9192 Unspecified hearing loss, left ear: Secondary | ICD-10-CM | POA: Diagnosis not present

## 2021-06-27 DIAGNOSIS — H6983 Other specified disorders of Eustachian tube, bilateral: Secondary | ICD-10-CM | POA: Diagnosis not present

## 2021-07-31 DIAGNOSIS — Z681 Body mass index (BMI) 19 or less, adult: Secondary | ICD-10-CM | POA: Diagnosis not present

## 2021-07-31 DIAGNOSIS — S46819A Strain of other muscles, fascia and tendons at shoulder and upper arm level, unspecified arm, initial encounter: Secondary | ICD-10-CM | POA: Diagnosis not present

## 2021-08-25 DIAGNOSIS — Z01419 Encounter for gynecological examination (general) (routine) without abnormal findings: Secondary | ICD-10-CM | POA: Diagnosis not present

## 2021-08-25 DIAGNOSIS — Z1231 Encounter for screening mammogram for malignant neoplasm of breast: Secondary | ICD-10-CM | POA: Diagnosis not present

## 2021-08-25 DIAGNOSIS — Z681 Body mass index (BMI) 19 or less, adult: Secondary | ICD-10-CM | POA: Diagnosis not present

## 2021-09-03 ENCOUNTER — Encounter: Payer: Self-pay | Admitting: Allergy and Immunology

## 2021-09-03 ENCOUNTER — Other Ambulatory Visit: Payer: Self-pay

## 2021-09-03 ENCOUNTER — Ambulatory Visit (INDEPENDENT_AMBULATORY_CARE_PROVIDER_SITE_OTHER): Payer: BC Managed Care – PPO | Admitting: Allergy and Immunology

## 2021-09-03 VITALS — BP 118/78 | HR 76 | Resp 16 | Ht 67.0 in | Wt 125.6 lb

## 2021-09-03 DIAGNOSIS — K219 Gastro-esophageal reflux disease without esophagitis: Secondary | ICD-10-CM

## 2021-09-03 DIAGNOSIS — J3489 Other specified disorders of nose and nasal sinuses: Secondary | ICD-10-CM | POA: Diagnosis not present

## 2021-09-03 DIAGNOSIS — J320 Chronic maxillary sinusitis: Secondary | ICD-10-CM | POA: Diagnosis not present

## 2021-09-03 DIAGNOSIS — H04123 Dry eye syndrome of bilateral lacrimal glands: Secondary | ICD-10-CM

## 2021-09-03 DIAGNOSIS — J3089 Other allergic rhinitis: Secondary | ICD-10-CM | POA: Diagnosis not present

## 2021-09-03 MED ORDER — FAMOTIDINE 40 MG PO TABS: 40.0000 mg | ORAL_TABLET | Freq: Every day | ORAL | 11 refills | Status: DC

## 2021-09-03 MED ORDER — RYALTRIS 665-25 MCG/ACT NA SUSP: 2.0000 | Freq: Two times a day (BID) | NASAL | 11 refills | Status: DC

## 2021-09-03 MED ORDER — OMEPRAZOLE 40 MG PO CPDR: DELAYED_RELEASE_CAPSULE | ORAL | 5 refills | Status: DC

## 2021-09-03 MED ORDER — IPRATROPIUM BROMIDE 0.06 % NA SOLN: NASAL | 11 refills | Status: DC

## 2021-09-03 NOTE — Patient Instructions (Addendum)
  1.  Allergen avoidance measures. Discontinue all oral antihistamines  2.  Treat and prevent inflammation:  A. Ryaltris - 2 sprays each nostril 1-2 times per day (specialty pharmacy)  3.  Treat and prevent reflux/LPR:  A. Omeprazole 40 mg - 1 tablet 2 times per day B. Famotidine 40 mg - 1 tablet in evening C.  Minimize caffeine and chocolate consumption D.  Replace throat clearing with swallowing/drinking maneuver  4.  If needed:  A. Nasal saline B. Nasal ipratropium 0.06% - 2 sprays every 6 hors to dry nose C. Systane gel drop in eyes before bed D. Systane drops multiple times per day  5.  Obtain flu vaccine  6.  Immunotherapy???    7. Return to clinic in 4 weeks or earlier if problem

## 2021-09-03 NOTE — Progress Notes (Signed)
[facility] [place] [facility] [place] [facility] [place] [facility] [place]   NEW PATIENT NOTE  Referring Provider: Heywood Bene, MD Primary Provider: Buckner Malta, MD Date of office visit: 09/03/2021    Subjective:   Chief Complaint:  Cathy Whitaker (DOB: 04/29/1966) is a 55 y.o. female who presents to the clinic on 09/03/2021 with a chief complaint of Nasal Congestion (Runny nose) and Sinus Problem .     HPI: Cathy Whitaker presents to this clinic in evaluation of allergic disease.  She has a long history of allergic rhinoconjunctivitis and apparently had some form of skin testing performed as a young child but her allergic symptoms involving her nose and eyes improved significantly as a young adult but they have returned over the course of the past few years.  This year she required sinus surgery for repetitive sinusitis with Dr. Annalee Genta.  In February 2022 it sounds as though she had repair of a deviated septum and sinus surgery for CT scan confirmed sinusitis.  Since her surgery she is much better regarding airflow through her nose but she still has symptoms.  Her current symptoms revolve around clear rhinorrhea from her nose and she thinks that it is mostly coming out of her left nostril.  And she has itchy eyes and watery eyes and puffy eyes.  She also has a raspy voice and throat clearing and phlegm stuck in her throat and inability to clear out her throat.  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  She does have preexistent reflux with indigestion high in her chest and regurgitation that is still active even though she is using antireflux medications with a proton pump inhibitor once a day.  She does drink a Dr. Reino Kent daily.  She has chocolate daily.  She does not drink alcohol.  She has a history of migraines that occur a few times per month that has been present for years that responds for the most part with the use of Imitrex.  History reviewed. No  pertinent past medical history.  Past Surgical History:  Procedure Laterality Date   CHOLECYSTECTOMY  05/2011   NASAL SINUS SURGERY  11/2020    Allergies as of 09/03/2021   No Known Allergies      Medication List    CALCIUM-MAGNESIUM-ZINC PO Take by mouth daily.   fluticasone 50 MCG/ACT nasal spray Commonly known as: FLONASE SMARTSIG:2 Spray(s) Both Nares Every Night   levocetirizine 5 MG tablet Commonly known as: XYZAL Take 5 mg by mouth daily.   omeprazole 40 MG capsule Commonly known as: PRILOSEC Take 40 mg by mouth daily.   promethazine 25 MG tablet Commonly known as: PHENERGAN Take 25 mg by mouth every 8 (eight) hours as needed.   SUMAtriptan 100 MG tablet Commonly known as: IMITREX Take 100 mg by mouth as needed.   VITAMIN D3 PO Take 50 mcg by mouth daily.   VITAMIN E PO Take 400 Units by mouth daily.    Review of systems negative except as noted in HPI / PMHx or noted below:  Review of Systems  Constitutional: Negative.   HENT: Negative.    Eyes: Negative.   Respiratory: Negative.    Cardiovascular: Negative.   Gastrointestinal: Negative.   Genitourinary: Negative.   Musculoskeletal: Negative.   Skin: Negative.   Neurological: Negative.   Endo/Heme/Allergies: Negative.   Psychiatric/Behavioral: Negative.     Family History  Problem Relation Age of Onset   Heart attack Mother  Emphysema Father    Stroke Father    Diabetes Sister    Breast cancer Sister    Heart Problems Brother    Diabetes Maternal Grandmother    Heart attack Maternal Grandfather     Social History   Socioeconomic History   Marital status: Married    Spouse name: Not on file   Number of children: Not on file   Years of education: Not on file   Highest education level: Not on file  Occupational History   Not on file  Tobacco Use   Smoking status: Never   Smokeless tobacco: Never  Substance and Sexual Activity   Alcohol use: Not Currently   Drug use: Never    Sexual activity: Not on file  Other Topics Concern   Not on file  Social History Narrative   Not on file   Environmental and Social history  Lives in a house with a dry environment, cat and dog located inside the household, no carpet in the bedroom, no plastic on the bed, no plastic on the pillow, no smoking ongoing with inside the household.  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  Objective:   Vitals:   09/03/21 0913  BP: 118/78  Pulse: 76  Resp: 16  SpO2: 99%   Height: 5\' 7"  (170.2 cm) Weight: 125 lb 9.6 oz (57 kg)  Physical Exam Constitutional:      Appearance: She is not diaphoretic.     Comments: Raspy voice, throat clearing  HENT:     Head: Normocephalic.     Right Ear: Tympanic membrane, ear canal and external ear normal.     Left Ear: Tympanic membrane, ear canal and external ear normal.     Nose: Nose normal. No mucosal edema or rhinorrhea.     Mouth/Throat:     Pharynx: Uvula midline. No oropharyngeal exudate.  Eyes:     Conjunctiva/sclera: Conjunctivae normal.  Neck:     Thyroid: No thyromegaly.     Trachea: Trachea normal. No tracheal tenderness or tracheal deviation.  Cardiovascular:     Rate and Rhythm: Normal rate and regular rhythm.     Heart sounds: Normal heart sounds, S1 normal and S2 normal. No murmur heard. Pulmonary:     Effort: No respiratory distress.     Breath sounds: Normal breath sounds. No stridor. No wheezing or rales.  Lymphadenopathy:     Head:     Right side of head: No tonsillar adenopathy.     Left side of head: No tonsillar adenopathy.     Cervical: No cervical adenopathy.  Skin:    Findings: No erythema or rash.     Nails: There is no clubbing.  Neurological:     Mental Status: She is alert.    Diagnostics: Allergy skin tests were performed.  She did not demonstrate any hypersensitivity against a screening panel of aeroallergens.  Results of a sinus CT scan obtained 09 October 2020  identified the following:  Paranasal sinuses:  Frontal: Normally aerated. Patent frontal sinus drainage pathways.  Ethmoid: Few opacified ethmoid air cells.  Maxillary: Bilateral mucosal thickening and a small amount of layering fluid consistent with bilateral maxillary rhinosinusitis.  Sphenoid: Normally aerated. Patent sphenoethmoidal recesses.  Right ostiomeatal unit: Obscured by regional mucosal thickening.  Left ostiomeatal unit: Obscured by regional mucosal thickening.  Nasal passages: Patent. Nasal septum bows 2 mm towards the left.  Anatomy: No pneumatization superior to anterior ethmoid notches. Symmetric  and intact olfactory grooves and fovea ethmoidalis, Keros II (4-76mm). Sellar sphenoid pneumatization pattern. No dehiscence of carotid or optic canals. No onodi cell.   Assessment and Plan:    1. Perennial allergic rhinitis   2. Chronic maxillary sinusitis   3. Rhinorrhea   4. LPRD (laryngopharyngeal reflux disease)   5. Dry eye syndrome of both eyes     1.  Allergen avoidance measures. Discontinue all oral antihistamines  2.  Treat and prevent inflammation:  A. Ryaltris - 2 sprays each nostril 1-2 times per day (specialty pharmacy)  3.  Treat and prevent reflux/LPR:  A. Omeprazole 40 mg - 1 tablet 2 times per day B. Famotidine 40 mg - 1 tablet in evening C.  Minimize caffeine and chocolate consumption D.  Replace throat clearing with swallowing/drinking maneuver  4.  If needed:  A. Nasal saline B. Nasal ipratropium 0.06% - 2 sprays every 6 hors to dry nose C. Systane gel drop in eyes before bed D. Systane drops multiple times per day  5.  Obtain flu vaccine  6.  Immunotherapy???    7. Return to clinic in 4 weeks or earlier if problem  Ardeth appears to have an inflamed and irritated respiratory tract without an obvious atopic trigger and also appears to have dry eye syndrome.  The inflammation of her respiratory tract may be precipitated by reflux and she has  a history very consistent with LPR.  We will treat her with a combination of a nasal antihistamine and nasal steroid while she discontinues all oral antihistamines.  Hopefully this will result in less irritation of her respiratory tract and also help prevent her from worsening her dry eye syndrome.  And she will start therapy for LPR as noted above.  I will see her back in this clinic in 4 weeks to make a determination about further evaluation and treatment based upon her response.  Jessica Priest, MD Allergy / Immunology [facility]

## 2021-09-08 ENCOUNTER — Encounter: Payer: Self-pay | Admitting: Allergy and Immunology

## 2021-09-24 DIAGNOSIS — D2239 Melanocytic nevi of other parts of face: Secondary | ICD-10-CM | POA: Diagnosis not present

## 2021-09-24 DIAGNOSIS — L821 Other seborrheic keratosis: Secondary | ICD-10-CM | POA: Diagnosis not present

## 2021-09-24 DIAGNOSIS — L814 Other melanin hyperpigmentation: Secondary | ICD-10-CM | POA: Diagnosis not present

## 2021-09-24 DIAGNOSIS — D225 Melanocytic nevi of trunk: Secondary | ICD-10-CM | POA: Diagnosis not present

## 2021-09-24 DIAGNOSIS — L820 Inflamed seborrheic keratosis: Secondary | ICD-10-CM | POA: Diagnosis not present

## 2021-10-01 ENCOUNTER — Ambulatory Visit (INDEPENDENT_AMBULATORY_CARE_PROVIDER_SITE_OTHER): Payer: BC Managed Care – PPO | Admitting: Allergy and Immunology

## 2021-10-01 ENCOUNTER — Encounter: Payer: Self-pay | Admitting: Allergy and Immunology

## 2021-10-01 ENCOUNTER — Other Ambulatory Visit: Payer: Self-pay

## 2021-10-01 VITALS — BP 102/84 | HR 72 | Resp 14

## 2021-10-01 DIAGNOSIS — J3489 Other specified disorders of nose and nasal sinuses: Secondary | ICD-10-CM

## 2021-10-01 DIAGNOSIS — K219 Gastro-esophageal reflux disease without esophagitis: Secondary | ICD-10-CM | POA: Diagnosis not present

## 2021-10-01 DIAGNOSIS — J320 Chronic maxillary sinusitis: Secondary | ICD-10-CM | POA: Diagnosis not present

## 2021-10-01 DIAGNOSIS — J3089 Other allergic rhinitis: Secondary | ICD-10-CM | POA: Diagnosis not present

## 2021-10-01 DIAGNOSIS — H04123 Dry eye syndrome of bilateral lacrimal glands: Secondary | ICD-10-CM

## 2021-10-01 NOTE — Progress Notes (Signed)
Cathy Whitaker - Cathy Whitaker - Cathy Whitaker - Cathy Whitaker - Cathy Whitaker   Follow-up Note  Referring Provider: Buckner Malta, MD Primary Provider: Buckner Malta, MD Date of Office Visit: 10/01/2021  Subjective:   Cathy Whitaker (DOB: 02-05-1966) is a 55 y.o. female who [facility] in re-evaluation of the following:  HPI: Cathy Whitaker returns to this clinic in reevaluation of rhinitis, history of chronic sinusitis, history of persistent rhinorrhea, LPR, and dry eye syndrome.  I last saw her in this clinic during her initial evaluation of 03 September 2021.  She is significantly improved on her current plan.    She believes that her nose is doing much better.  Her rhinorrhea has discontinued.  Her throat has improved significantly.  Her throat clearing and mucus stuck in her throat has decreased.  Her reflux is better.  She still continues to drink a half a cup of Dr. Reino Whitaker every day and still has chocolate on a daily basis.  Her eyes have been doing well.  She remains away from oral antihistamine use.  Allergies as of 10/01/2021   No Known Allergies      Medication List    CALCIUM-MAGNESIUM-ZINC PO Take by mouth daily.   cyclobenzaprine 5 MG tablet Commonly known as: FLEXERIL Take 5 mg by mouth at bedtime as needed.   estradiol 0.1 MG/GM vaginal cream Commonly known as: ESTRACE Place vaginally.   famotidine 40 MG tablet Commonly known as: PEPCID Take 1 tablet (40 mg total) by mouth at bedtime.   ipratropium 0.06 % nasal spray Commonly known as: ATROVENT Can use two sprays in each nostril every 6 hours if needed to dry up nose.   omeprazole 40 MG capsule Commonly known as: PRILOSEC Take one capsule by mouth twice daily.   promethazine 25 MG tablet Commonly known as: PHENERGAN Take 25 mg by mouth every 8 (eight) hours as needed.   Ryaltris 093-23 MCG/ACT Susp Generic drug: Olopatadine-Mometasone Place 2 sprays into both  nostrils in the morning and at bedtime.   SUMAtriptan 100 MG tablet Commonly known as: IMITREX Take 100 mg by mouth as needed.   VITAMIN D3 PO Take 50 mcg by mouth daily.   VITAMIN E PO Take 400 Units by mouth daily.    Past Medical History:  Diagnosis Date   Allergic rhinitis     Past Surgical History:  Procedure Laterality Date   CHOLECYSTECTOMY  05/2011   NASAL SINUS SURGERY  11/2020    Review of systems negative except as noted in HPI / PMHx or noted below:  Review of Systems  Constitutional: Negative.   HENT: Negative.    Eyes: Negative.   Respiratory: Negative.    Cardiovascular: Negative.   Gastrointestinal: Negative.   Genitourinary: Negative.   Musculoskeletal: Negative.   Skin: Negative.   Neurological: Negative.   Endo/Heme/Allergies: Negative.   Psychiatric/Behavioral: Negative.      Objective:   Vitals:   10/01/21 1029  BP: 102/84  Pulse: 72  Resp: 14  SpO2: 98%          Physical Exam Constitutional:      Appearance: She is not diaphoretic.  HENT:     Head: Normocephalic.     Right Ear: Tympanic membrane, ear canal and external ear normal.     Left Ear: Tympanic membrane, ear canal and external ear normal.     Nose: Nose normal. No mucosal edema or rhinorrhea.     Mouth/Throat:  Pharynx: Uvula midline. No oropharyngeal exudate.  Eyes:     Conjunctiva/sclera: Conjunctivae normal.  Neck:     Thyroid: No thyromegaly.     Trachea: Trachea normal. No tracheal tenderness or tracheal deviation.  Cardiovascular:     Rate and Rhythm: Normal rate and regular rhythm.     Heart sounds: Normal heart sounds, S1 normal and S2 normal. No murmur heard. Pulmonary:     Effort: No respiratory distress.     Breath sounds: Normal breath sounds. No stridor. No wheezing or rales.  Lymphadenopathy:     Head:     Right side of head: No tonsillar adenopathy.     Left side of head: No tonsillar adenopathy.     Cervical: No cervical adenopathy.   Skin:    Findings: No erythema or rash.     Nails: There is no clubbing.  Neurological:     Mental Status: She is alert.    Diagnostics: none   Assessment and Plan:   1. Perennial allergic rhinitis   2. Chronic maxillary sinusitis   3. Rhinorrhea   4. LPRD (laryngopharyngeal reflux disease)   5. Dry eye syndrome of both eyes     1.  Continue to treat and prevent inflammation:  A. Ryaltris - 2 sprays each nostril 1-2 times per day (specialty pharmacy)  2.  Continue to treat and prevent reflux/LPR:  A.  Omeprazole 40 mg - 1 tablet 2 times per day B.  Famotidine 40 mg - 1 tablet in evening C.  Minimize caffeine and chocolate consumption D.  Replace throat clearing with swallowing/drinking maneuver  3.  If needed:  A. Nasal saline B. Nasal ipratropium 0.06% - 2 sprays every 6 hors to dry nose C. Systane gel drop in eyes before bed D. Systane drops multiple times per day  4. Return to clinic in 8 weeks . Taper medications???  Cathy Whitaker is doing much better at this Whitaker in time.  She has only utilized 4 weeks of her plan and I would like to see her back in this clinic after utilizing a full 12 weeks as there will probably be an opportunity to consolidate some of her medical therapy at that Whitaker in time.  She will contact me during interval should there be a problem.  Cathy Schimke, MD Allergy / Immunology [facility]

## 2021-10-01 NOTE — Patient Instructions (Signed)
°  1.  Continue to treat and prevent inflammation:  A. Ryaltris - 2 sprays each nostril 1-2 times per day (specialty pharmacy)  2.  Continue to treat and prevent reflux/LPR:  A.  Omeprazole 40 mg - 1 tablet 2 times per day B.  Famotidine 40 mg - 1 tablet in evening C.  Minimize caffeine and chocolate consumption D.  Replace throat clearing with swallowing/drinking maneuver  3.  If needed:  A. Nasal saline B. Nasal ipratropium 0.06% - 2 sprays every 6 hors to dry nose C. Systane gel drop in eyes before bed D. Systane drops multiple times per day  4. Return to clinic in 8 weeks . Taper medications???

## 2021-10-02 ENCOUNTER — Encounter: Payer: Self-pay | Admitting: Allergy and Immunology

## 2021-11-24 ENCOUNTER — Ambulatory Visit: Payer: BC Managed Care – PPO | Admitting: Allergy and Immunology

## 2021-11-26 ENCOUNTER — Other Ambulatory Visit: Payer: Self-pay

## 2021-11-26 ENCOUNTER — Ambulatory Visit (INDEPENDENT_AMBULATORY_CARE_PROVIDER_SITE_OTHER): Payer: BC Managed Care – PPO | Admitting: Allergy and Immunology

## 2021-11-26 ENCOUNTER — Encounter: Payer: Self-pay | Admitting: Allergy and Immunology

## 2021-11-26 VITALS — BP 114/68 | HR 66 | Resp 16

## 2021-11-26 DIAGNOSIS — K219 Gastro-esophageal reflux disease without esophagitis: Secondary | ICD-10-CM

## 2021-11-26 DIAGNOSIS — J3089 Other allergic rhinitis: Secondary | ICD-10-CM | POA: Diagnosis not present

## 2021-11-26 DIAGNOSIS — H04123 Dry eye syndrome of bilateral lacrimal glands: Secondary | ICD-10-CM | POA: Diagnosis not present

## 2021-11-26 NOTE — Progress Notes (Signed)
[place] - [place] - [place] - [place] - [place]   Follow-up Note  Referring Provider: Buckner Malta, MD Primary Provider: Buckner Malta, MD Date of Office Visit: 11/26/2021  Subjective:   Cathy Whitaker (DOB: 1966/02/04) is a 56 y.o. female who [facility]21 in re-evaluation of the following:  HPI: Cathy Whitaker returns to this clinic in evaluation of rhinitis, LPR, and dry eye syndrome.  I last saw her in this clinic on 02 October 2021.  She continues to do very well since her last visit regarding her upper airway and throat issue and eye issue.  She has very little issues with nasal congestion or sneezing.  Her eyes feel much better now that she stopped oral antihistamine.  Occasionally she uses some Pataday or she uses some Systane.  She believes that her throat is doing very well and a lot of her throat clearing and mucus stuck in her throat has improved.  She still continues to drink Dr. Reino Kent.  She still continues to use therapy directed against LPR and continues on a combination nasal antihistamine/steroid spray  Allergies as of 11/26/2021   No Known Allergies      Medication List    CALCIUM-MAGNESIUM-ZINC PO Take by mouth daily.   cyclobenzaprine 5 MG tablet Commonly known as: FLEXERIL Take 5 mg by mouth at bedtime as needed.   estradiol 0.1 MG/GM vaginal cream Commonly known as: ESTRACE Place vaginally.   famotidine 40 MG tablet Commonly known as: PEPCID Take 1 tablet (40 mg total) by mouth at bedtime.   ipratropium 0.06 % nasal spray Commonly known as: ATROVENT Can use two sprays in each nostril every 6 hours if needed to dry up nose.   omeprazole 40 MG capsule Commonly known as: PRILOSEC Take one capsule by mouth twice daily.   promethazine 25 MG tablet Commonly known as: PHENERGAN Take 25 mg by mouth every 8 (eight) hours as needed.   Ryaltris 026-37 MCG/ACT Susp Generic drug:  Olopatadine-Mometasone Place 2 sprays into both nostrils in the morning and at bedtime.   SUMAtriptan 100 MG tablet Commonly known as: IMITREX Take 100 mg by mouth as needed.   VITAMIN D3 PO Take 50 mcg by mouth daily.   VITAMIN E PO Take 400 Units by mouth daily.    Past Medical History:  Diagnosis Date   Allergic rhinitis     Past Surgical History:  Procedure Laterality Date   CHOLECYSTECTOMY  05/2011   NASAL SINUS SURGERY  11/2020    Review of systems negative except as noted in HPI / PMHx or noted below:  Review of Systems  Constitutional: Negative.   HENT: Negative.    Eyes: Negative.   Respiratory: Negative.    Cardiovascular: Negative.   Gastrointestinal: Negative.   Genitourinary: Negative.   Musculoskeletal: Negative.   Skin: Negative.   Neurological: Negative.   Endo/Heme/Allergies: Negative.   Psychiatric/Behavioral: Negative.      Objective:   Vitals:   11/26/21 1043  BP: 114/68  Pulse: 66  Resp: 16  SpO2: 99%          Physical Exam Constitutional:      Appearance: She is not diaphoretic.  HENT:     Head: Normocephalic.     Right Ear: Tympanic membrane, ear canal and external ear normal.     Left Ear: Tympanic membrane, ear canal and external ear normal.     Nose: Nose normal. No mucosal edema or rhinorrhea.  Mouth/Throat:     Pharynx: Uvula midline. No oropharyngeal exudate.  Eyes:     Conjunctiva/sclera: Conjunctivae normal.  Neck:     Thyroid: No thyromegaly.     Trachea: Trachea normal. No tracheal tenderness or tracheal deviation.  Cardiovascular:     Rate and Rhythm: Normal rate and regular rhythm.     Heart sounds: Normal heart sounds, S1 normal and S2 normal. No murmur heard. Pulmonary:     Effort: No respiratory distress.     Breath sounds: Normal breath sounds. No stridor. No wheezing or rales.  Lymphadenopathy:     Head:     Right side of head: No tonsillar adenopathy.     Left side of head: No tonsillar  adenopathy.     Cervical: No cervical adenopathy.  Skin:    Findings: No erythema or rash.     Nails: There is no clubbing.  Neurological:     Mental Status: She is alert.    Diagnostics: none  Assessment and Plan:   1. Perennial allergic rhinitis   2. LPRD (laryngopharyngeal reflux disease)   3. Dry eye syndrome of both eyes     1.  Continue to treat and prevent inflammation:  A. Ryaltris - 2 sprays each nostril 1-2 times per day    2.  Continue to treat and prevent reflux/LPR:  A.  Omeprazole 40 mg - 1 tablet 2 times per day B.  Minimize caffeine and chocolate consumption D.  Replace throat clearing with swallowing/drinking maneuver  3.  If needed:  A. Nasal saline B. Systane gel drop in eyes before bed C. Systane drops multiple times per day D. Famotidine 40 mg - 1 tablet in evening    4. Return to clinic in 1 year or earlier if problem  [name] appears to be doing very well and she has a very good understanding of her disease state and how her medications work and appropriate dosing of her medications depending on disease activity.  We will see if we can lower her dose of therapy for LPR by moving her famotidine to an as needed medication.  Assuming she does well with this plan I will see her back in this clinic in 1 year or earlier if there is a problem.  [name], MD Allergy / Immunology [facility]

## 2021-11-26 NOTE — Patient Instructions (Addendum)
°  1.  Continue to treat and prevent inflammation:  A. Ryaltris - 2 sprays each nostril 1-2 times per day    2.  Continue to treat and prevent reflux/LPR:  A.  Omeprazole 40 mg - 1 tablet 2 times per day B.  Minimize caffeine and chocolate consumption D.  Replace throat clearing with swallowing/drinking maneuver  3.  If needed:  A. Nasal saline B. Systane gel drop in eyes before bed C. Systane drops multiple times per day D. Famotidine 40 mg - 1 tablet in evening    4. Return to clinic in 1 year or earlier if problem

## 2021-11-27 ENCOUNTER — Encounter: Payer: Self-pay | Admitting: Allergy and Immunology

## 2021-12-17 DIAGNOSIS — N952 Postmenopausal atrophic vaginitis: Secondary | ICD-10-CM | POA: Diagnosis not present

## 2021-12-17 DIAGNOSIS — Z79899 Other long term (current) drug therapy: Secondary | ICD-10-CM | POA: Diagnosis not present

## 2021-12-17 DIAGNOSIS — R5383 Other fatigue: Secondary | ICD-10-CM | POA: Diagnosis not present

## 2021-12-17 DIAGNOSIS — N951 Menopausal and female climacteric states: Secondary | ICD-10-CM | POA: Diagnosis not present

## 2021-12-19 DIAGNOSIS — Z86010 Personal history of colonic polyps: Secondary | ICD-10-CM | POA: Diagnosis not present

## 2021-12-19 DIAGNOSIS — Z1211 Encounter for screening for malignant neoplasm of colon: Secondary | ICD-10-CM | POA: Diagnosis not present

## 2022-01-14 DIAGNOSIS — M25562 Pain in left knee: Secondary | ICD-10-CM | POA: Diagnosis not present

## 2022-01-28 DIAGNOSIS — M25562 Pain in left knee: Secondary | ICD-10-CM | POA: Diagnosis not present

## 2022-02-09 DIAGNOSIS — M25562 Pain in left knee: Secondary | ICD-10-CM | POA: Diagnosis not present

## 2022-02-09 DIAGNOSIS — S83282A Other tear of lateral meniscus, current injury, left knee, initial encounter: Secondary | ICD-10-CM | POA: Diagnosis not present

## 2022-02-09 DIAGNOSIS — M1712 Unilateral primary osteoarthritis, left knee: Secondary | ICD-10-CM | POA: Diagnosis not present

## 2022-05-13 DIAGNOSIS — Z13220 Encounter for screening for lipoid disorders: Secondary | ICD-10-CM | POA: Diagnosis not present

## 2022-05-13 DIAGNOSIS — Z0000 Encounter for general adult medical examination without abnormal findings: Secondary | ICD-10-CM | POA: Diagnosis not present

## 2022-05-22 DIAGNOSIS — Z0000 Encounter for general adult medical examination without abnormal findings: Secondary | ICD-10-CM | POA: Diagnosis not present

## 2022-05-22 DIAGNOSIS — Z1331 Encounter for screening for depression: Secondary | ICD-10-CM | POA: Diagnosis not present

## 2022-05-22 DIAGNOSIS — Z681 Body mass index (BMI) 19 or less, adult: Secondary | ICD-10-CM | POA: Diagnosis not present

## 2022-07-13 DIAGNOSIS — R5383 Other fatigue: Secondary | ICD-10-CM | POA: Diagnosis not present

## 2022-07-13 DIAGNOSIS — N952 Postmenopausal atrophic vaginitis: Secondary | ICD-10-CM | POA: Diagnosis not present

## 2022-07-13 DIAGNOSIS — Z79899 Other long term (current) drug therapy: Secondary | ICD-10-CM | POA: Diagnosis not present

## 2022-07-13 DIAGNOSIS — N951 Menopausal and female climacteric states: Secondary | ICD-10-CM | POA: Diagnosis not present

## 2022-07-21 DIAGNOSIS — J302 Other seasonal allergic rhinitis: Secondary | ICD-10-CM | POA: Diagnosis not present

## 2022-07-21 DIAGNOSIS — Z681 Body mass index (BMI) 19 or less, adult: Secondary | ICD-10-CM | POA: Diagnosis not present

## 2022-07-21 DIAGNOSIS — D72829 Elevated white blood cell count, unspecified: Secondary | ICD-10-CM | POA: Diagnosis not present

## 2022-07-21 DIAGNOSIS — G43909 Migraine, unspecified, not intractable, without status migrainosus: Secondary | ICD-10-CM | POA: Diagnosis not present

## 2022-07-23 DIAGNOSIS — M79675 Pain in left toe(s): Secondary | ICD-10-CM | POA: Diagnosis not present

## 2022-09-21 DIAGNOSIS — Z124 Encounter for screening for malignant neoplasm of cervix: Secondary | ICD-10-CM | POA: Diagnosis not present

## 2022-09-21 DIAGNOSIS — Z6820 Body mass index (BMI) 20.0-20.9, adult: Secondary | ICD-10-CM | POA: Diagnosis not present

## 2022-09-21 DIAGNOSIS — Z1231 Encounter for screening mammogram for malignant neoplasm of breast: Secondary | ICD-10-CM | POA: Diagnosis not present

## 2022-09-21 DIAGNOSIS — Z01419 Encounter for gynecological examination (general) (routine) without abnormal findings: Secondary | ICD-10-CM | POA: Diagnosis not present

## 2022-11-02 ENCOUNTER — Other Ambulatory Visit: Payer: Self-pay

## 2022-11-02 MED ORDER — OMEPRAZOLE 40 MG PO CPDR: DELAYED_RELEASE_CAPSULE | ORAL | 0 refills | Status: DC

## 2022-11-24 DIAGNOSIS — L608 Other nail disorders: Secondary | ICD-10-CM | POA: Diagnosis not present

## 2022-12-21 ENCOUNTER — Encounter: Payer: Self-pay | Admitting: Allergy and Immunology

## 2022-12-21 ENCOUNTER — Ambulatory Visit (INDEPENDENT_AMBULATORY_CARE_PROVIDER_SITE_OTHER): Payer: BC Managed Care – PPO | Admitting: Allergy and Immunology

## 2022-12-21 VITALS — BP 126/78 | HR 68 | Resp 14 | Ht 67.0 in | Wt 130.2 lb

## 2022-12-21 DIAGNOSIS — J3089 Other allergic rhinitis: Secondary | ICD-10-CM | POA: Diagnosis not present

## 2022-12-21 DIAGNOSIS — H04123 Dry eye syndrome of bilateral lacrimal glands: Secondary | ICD-10-CM | POA: Diagnosis not present

## 2022-12-21 DIAGNOSIS — K219 Gastro-esophageal reflux disease without esophagitis: Secondary | ICD-10-CM | POA: Diagnosis not present

## 2022-12-21 MED ORDER — OMEPRAZOLE 40 MG PO CPDR: DELAYED_RELEASE_CAPSULE | ORAL | 3 refills | Status: DC

## 2022-12-21 NOTE — Patient Instructions (Addendum)
  1.  Continue to treat and prevent inflammation:  A. Flonase - 1-2 sprays each nostril 1-2 times per day    2.  Continue to treat and prevent reflux/LPR:  A.  Omeprazole 40 mg - 1 tablet 1-2 times per day B.  Minimize caffeine and chocolate consumption D.  Replace throat clearing with swallowing/drinking maneuver  3.  If needed:  A. Nasal saline B. Systane gel drop in eyes before bed C. Systane drops multiple times per day    4. Return to clinic if problem

## 2022-12-21 NOTE — Progress Notes (Unsigned)
[place]   Follow-up Note  Referring Provider: Serita Grammes, MD Primary Provider: Serita Grammes, MD Date of Office Visit: 12/21/2022  Subjective:   Cathy Whitaker (DOB: Nov 16, 1965) is a 57 y.o. female who returns to the [facility] on 12/21/2022 in re-evaluation of the following:  HPI: Cathy Whitaker turns to this clinic in evaluation of LPR, rhinitis, dry eye.  I last saw her in this clinic 26 November 2021.  She continues to do very well regarding her nasal issue while using a nasal steroid and she continues to do very well regarding her LPR issue while using a proton pump inhibitor twice a day.  During her last visit we recommended that she stopped her famotidine and she has been able to do so successfully.  Allergies as of 12/21/2022   No Known Allergies      Medication List    CALCIUM-MAGNESIUM-ZINC PO Take by mouth daily.   estradiol 0.1 MG/GM vaginal cream Commonly known as: ESTRACE Place vaginally.   fluticasone 50 MCG/ACT nasal spray Commonly known as: FLONASE Place 1 spray into both nostrils daily.   MILK THISTLE PO Take by mouth daily.   omeprazole 40 MG capsule Commonly known as: PRILOSEC Take one capsule by mouth twice daily.   promethazine 25 MG tablet Commonly known as: PHENERGAN Take 25 mg by mouth every 8 (eight) hours as needed.   SUMAtriptan 100 MG tablet Commonly known as: IMITREX Take 100 mg by mouth as needed.   VITAMIN D3 PO Take 50 mcg by mouth daily.   VITAMIN E PO Take 400 Units by mouth daily.    Past Medical History:  Diagnosis Date   Allergic rhinitis     Past Surgical History:  Procedure Laterality Date   CHOLECYSTECTOMY  05/2011   NASAL SINUS SURGERY  11/2020    Review of systems negative except as noted in HPI / PMHx or noted below:  Review of Systems  Constitutional: Negative.   HENT: Negative.    Eyes: Negative.   Respiratory: Negative.     Cardiovascular: Negative.   Gastrointestinal: Negative.   Genitourinary: Negative.   Musculoskeletal: Negative.   Skin: Negative.   Neurological: Negative.   Endo/Heme/Allergies: Negative.   Psychiatric/Behavioral: Negative.       Objective:   Vitals:   12/21/22 1038  BP: 126/78  Pulse: 68  Resp: 14  SpO2: 99%   Height: '5\' 7"'$  (170.2 cm)  Weight: 130 lb 3.2 oz (59.1 kg)   Physical Exam Constitutional:      Appearance: She is not diaphoretic.  HENT:     Head: Normocephalic.     Right Ear: Tympanic membrane, ear canal and external ear normal.     Left Ear: Tympanic membrane, ear canal and external ear normal.     Nose: Nose normal. No mucosal edema or rhinorrhea.     Mouth/Throat:     Pharynx: Uvula midline. No oropharyngeal exudate.  Eyes:     Conjunctiva/sclera: Conjunctivae normal.  Neck:     Thyroid: No thyromegaly.     Trachea: Trachea normal. No tracheal tenderness or tracheal deviation.  Cardiovascular:     Rate and Rhythm: Normal rate and regular rhythm.     Heart sounds: Normal heart sounds, S1 normal and S2 normal. No murmur heard. Pulmonary:     Effort: No respiratory distress.     Breath sounds: Normal breath sounds. No stridor. No wheezing or rales.  Lymphadenopathy:  Head:     Right side of head: No tonsillar adenopathy.     Left side of head: No tonsillar adenopathy.     Cervical: No cervical adenopathy.  Skin:    Findings: No erythema or rash.     Nails: There is no clubbing.  Neurological:     Mental Status: She is alert.     Diagnostics:   Assessment and Plan:   1. Perennial allergic rhinitis   2. LPRD (laryngopharyngeal reflux disease)   3. Dry eye syndrome of both eyes    1.  Continue to treat and prevent inflammation:  A. Flonase - 1-2 sprays each nostril 1-2 times per day    2.  Continue to treat and prevent reflux/LPR:  A.  Omeprazole 40 mg - 1 tablet 1-2 times per day B.  Minimize caffeine and chocolate consumption D.   Replace throat clearing with swallowing/drinking maneuver  3.  If needed:  A. Nasal saline B. Systane gel drop in eyes before bed C. Systane drops multiple times per day    4. Return to clinic if problem  Cathy Whitaker is doing very well and she has a very good understanding of her disease state involving her upper airway and throat and I do not see any need for her to return to this clinic on a regular basis as long as she can get refills from her primary care doctor.  I will be very happy to see her in this clinic should there be a problem as she moves forward.  Allena Katz, MD Allergy / Immunology [place]

## 2022-12-22 ENCOUNTER — Encounter: Payer: Self-pay | Admitting: Allergy and Immunology

## 2022-12-30 ENCOUNTER — Other Ambulatory Visit: Payer: Self-pay

## 2023-01-05 DIAGNOSIS — Z6820 Body mass index (BMI) 20.0-20.9, adult: Secondary | ICD-10-CM | POA: Diagnosis not present

## 2023-01-05 DIAGNOSIS — M62830 Muscle spasm of back: Secondary | ICD-10-CM | POA: Diagnosis not present

## 2023-01-05 DIAGNOSIS — M5489 Other dorsalgia: Secondary | ICD-10-CM | POA: Diagnosis not present

## 2023-01-07 DIAGNOSIS — M546 Pain in thoracic spine: Secondary | ICD-10-CM | POA: Diagnosis not present

## 2023-01-07 DIAGNOSIS — M5416 Radiculopathy, lumbar region: Secondary | ICD-10-CM | POA: Diagnosis not present

## 2023-01-07 DIAGNOSIS — M5451 Vertebrogenic low back pain: Secondary | ICD-10-CM | POA: Diagnosis not present

## 2023-01-07 DIAGNOSIS — M41125 Adolescent idiopathic scoliosis, thoracolumbar region: Secondary | ICD-10-CM | POA: Diagnosis not present

## 2023-01-13 DIAGNOSIS — R5383 Other fatigue: Secondary | ICD-10-CM | POA: Diagnosis not present

## 2023-01-13 DIAGNOSIS — N952 Postmenopausal atrophic vaginitis: Secondary | ICD-10-CM | POA: Diagnosis not present

## 2023-01-13 DIAGNOSIS — Z79899 Other long term (current) drug therapy: Secondary | ICD-10-CM | POA: Diagnosis not present

## 2023-01-13 DIAGNOSIS — N951 Menopausal and female climacteric states: Secondary | ICD-10-CM | POA: Diagnosis not present

## 2023-02-01 DIAGNOSIS — M546 Pain in thoracic spine: Secondary | ICD-10-CM | POA: Diagnosis not present

## 2023-02-01 DIAGNOSIS — M545 Low back pain, unspecified: Secondary | ICD-10-CM | POA: Diagnosis not present

## 2023-02-11 DIAGNOSIS — M546 Pain in thoracic spine: Secondary | ICD-10-CM | POA: Diagnosis not present

## 2023-02-11 DIAGNOSIS — M545 Low back pain, unspecified: Secondary | ICD-10-CM | POA: Diagnosis not present

## 2023-02-16 DIAGNOSIS — M545 Low back pain, unspecified: Secondary | ICD-10-CM | POA: Diagnosis not present

## 2023-02-16 DIAGNOSIS — M546 Pain in thoracic spine: Secondary | ICD-10-CM | POA: Diagnosis not present

## 2023-02-18 DIAGNOSIS — Z6820 Body mass index (BMI) 20.0-20.9, adult: Secondary | ICD-10-CM | POA: Diagnosis not present

## 2023-02-18 DIAGNOSIS — R0789 Other chest pain: Secondary | ICD-10-CM | POA: Diagnosis not present

## 2023-02-18 DIAGNOSIS — K219 Gastro-esophageal reflux disease without esophagitis: Secondary | ICD-10-CM | POA: Diagnosis not present

## 2023-02-25 ENCOUNTER — Encounter: Payer: Self-pay | Admitting: Gastroenterology

## 2023-02-25 DIAGNOSIS — M546 Pain in thoracic spine: Secondary | ICD-10-CM | POA: Diagnosis not present

## 2023-02-25 DIAGNOSIS — M545 Low back pain, unspecified: Secondary | ICD-10-CM | POA: Diagnosis not present

## 2023-03-01 DIAGNOSIS — M546 Pain in thoracic spine: Secondary | ICD-10-CM | POA: Diagnosis not present

## 2023-03-01 DIAGNOSIS — M545 Low back pain, unspecified: Secondary | ICD-10-CM | POA: Diagnosis not present

## 2023-03-10 DIAGNOSIS — M546 Pain in thoracic spine: Secondary | ICD-10-CM | POA: Diagnosis not present

## 2023-03-10 DIAGNOSIS — M545 Low back pain, unspecified: Secondary | ICD-10-CM | POA: Diagnosis not present

## 2023-03-23 DIAGNOSIS — M5416 Radiculopathy, lumbar region: Secondary | ICD-10-CM | POA: Diagnosis not present

## 2023-03-23 DIAGNOSIS — M41125 Adolescent idiopathic scoliosis, thoracolumbar region: Secondary | ICD-10-CM | POA: Diagnosis not present

## 2023-03-25 DIAGNOSIS — L608 Other nail disorders: Secondary | ICD-10-CM | POA: Diagnosis not present

## 2023-05-18 DIAGNOSIS — Z0000 Encounter for general adult medical examination without abnormal findings: Secondary | ICD-10-CM | POA: Diagnosis not present

## 2023-05-18 LAB — LAB REPORT - SCANNED: EGFR: 60

## 2023-05-26 DIAGNOSIS — Z0000 Encounter for general adult medical examination without abnormal findings: Secondary | ICD-10-CM | POA: Diagnosis not present

## 2023-05-26 DIAGNOSIS — Z6828 Body mass index (BMI) 28.0-28.9, adult: Secondary | ICD-10-CM | POA: Diagnosis not present

## 2023-06-01 ENCOUNTER — Ambulatory Visit (INDEPENDENT_AMBULATORY_CARE_PROVIDER_SITE_OTHER): Payer: BC Managed Care – PPO | Admitting: Gastroenterology

## 2023-06-01 ENCOUNTER — Encounter: Payer: Self-pay | Admitting: Gastroenterology

## 2023-06-01 VITALS — BP 100/62 | HR 76 | Ht 67.0 in | Wt 130.2 lb

## 2023-06-01 DIAGNOSIS — K219 Gastro-esophageal reflux disease without esophagitis: Secondary | ICD-10-CM | POA: Diagnosis not present

## 2023-06-01 DIAGNOSIS — R1013 Epigastric pain: Secondary | ICD-10-CM | POA: Diagnosis not present

## 2023-06-01 MED ORDER — FAMOTIDINE 20 MG PO TABS: 20.0000 mg | ORAL_TABLET | Freq: Every day | ORAL | 4 refills | Status: AC

## 2023-06-01 NOTE — Progress Notes (Signed)
Chief Complaint: GI workup  Referring Provider:  Buckner Malta, MD      ASSESSMENT AND PLAN;   #1. Refractory GERD  #2. Epi pain. S/P lap chole in past  Plan: -EGD -[place] abdo -Lab test results from Dr York Grice -Conitnue omeprazole 40mg  QAM, add pepcid 20 at bedtime -Obtain liver biopsy report from [facility] (from 2012, if possible)   HPI:    Cathy Whitaker is a 57 y.o. female  Accompanied by her husband With H/O allergic rhinitis (followed by Dr. Lucie Leather), laryngopharyngeal reflux (Dr Lucie Leather, Dr. Annalee Genta), migraines  C/O postprandial epi pain with reflux x 6 mts with postprandial bloating Has been better ever since she is taking omeprazole 40 mg p.o. daily Still with nocturnal symptoms Denies having any odynophagia or dysphagia Has been advised to get EGD No definite lactose or gluten intolerance by history  No significant nonsteroidals. She does drink 1 Dr. Reino Kent per day, some chocolates but no meds.  She denies having any diarrhea except when she eats out specially with salads-ever since she had cholecystectomy.  No constipation.  Her hoarseness is better since she has been on omeprazole  Status post lap chole with negative IOC by Dr. Georgiana Shore 2012.  She also had liver biopsy at that time for abnormal LFTs which showed fatty liver.  Her most recent labs including LFTs were normal.  No alcohol.  No weight gain.   Past GI [facility]: (Dr Charm Barges) -Colon Feb 2023: neg. Rpt 10 yrs -Colon 2019: Diminutive polyps.  CT AP 04/2011: Periportal edema.  Low-density lesion in the right hepatic lobe which is incompletely characterized.  Lap chole with IOC 05/29/2011 by Dr. Georgiana Shore for chronic cholecystitis with biliary dyskinesia.  She also had liver biopsy at that time showing fatty liver.  The actual liver biopsy report has been requested.  SH-married, accompanied by her husband.  No children.  Works in [employer] Past Medical History:  Diagnosis Date   Allergic  rhinitis    LPRD (laryngopharyngeal reflux disease)    Migraines     Past Surgical History:  Procedure Laterality Date   CHOLECYSTECTOMY  05/2011   NASAL SINUS SURGERY  11/2020    Family History  Problem Relation Age of Onset   Heart attack Mother    Hypertension Mother    Hyperlipidemia Mother    Arthritis Mother    AAA (abdominal aortic aneurysm) Mother    Emphysema Father    Stroke Father    Diabetes Sister    Breast cancer Sister    Heart Problems Brother    Diabetes Maternal Grandmother    Cancer Maternal Grandmother        in her back   Alzheimer's disease Maternal Grandmother    Heart attack Maternal Grandfather    Stroke Paternal Grandmother     Social History   Tobacco Use   Smoking status: Never   Smokeless tobacco: Never  Vaping Use   Vaping status: Never Used  Substance Use Topics   Alcohol use: Not Currently   Drug use: Never    Current Outpatient Medications  Medication Sig Dispense Refill   CALCIUM-MAGNESIUM-ZINC PO Take by mouth daily.     Cholecalciferol (VITAMIN D3 PO) Take 50 mcg by mouth daily.     Cyanocobalamin (VITAMIN B-12 PO) Take 1 tablet by mouth daily.     diclofenac (VOLTAREN) 75 MG EC tablet Take 75 mg by mouth 2 (two) times daily as needed.     estradiol (ESTRACE) 0.1 MG/GM  vaginal cream Place vaginally.     famotidine (PEPCID) 20 MG tablet Take 20 mg by mouth 2 (two) times daily.     fluticasone (FLONASE) 50 MCG/ACT nasal spray Place 1 spray into both nostrils daily.     loratadine (CLARITIN) 10 MG tablet Take 10 mg by mouth daily.     MILK THISTLE PO Take by mouth daily.     omeprazole (PRILOSEC) 40 MG capsule Take one capsule by mouth one to two times daily as directed. 180 capsule 3   promethazine (PHENERGAN) 25 MG tablet Take 25 mg by mouth every 8 (eight) hours as needed.     SUMAtriptan (IMITREX) 100 MG tablet Take 100 mg by mouth as needed.  12   VITAMIN E PO Take 400 Units by mouth daily.     cyclobenzaprine (FLEXERIL)  5 MG tablet Take 5 mg by mouth as needed. (Patient not taking: Reported on 06/01/2023)     gabapentin (NEURONTIN) 100 MG capsule Take 100 mg by mouth as needed. (Patient not taking: Reported on 06/01/2023)     valACYclovir (VALTREX) 500 MG tablet Take 500 mg by mouth as needed. (Patient not taking: Reported on 06/01/2023)     No current facility-administered medications for this visit.    No Known Allergies  Review of Systems:  Constitutional: Denies fever, chills, diaphoresis, appetite change and fatigue.  HEENT: Had allergic rhinitis Cardiovascular: Denies chest pain, palpitations and leg swelling.  Genitourinary: Denies dysuria, urgency, frequency, hematuria, flank pain and difficulty urinating.  Musculoskeletal: Denies myalgias, back pain, joint swelling, arthralgias and gait problem.  Skin: No rash.  Neurological: Denies dizziness, seizures, syncope, weakness, light-headedness, numbness and has headaches.  Hematological: Denies adenopathy. Easy bruising, personal or family bleeding history  Psychiatric/Behavioral: No anxiety or depression     Physical Exam:    BP 100/62 (BP Location: Left Arm, Patient Position: Sitting, Cuff Size: Normal)   Pulse 76   Ht 5\' 7"  (1.702 m)   Wt 130 lb 4 oz (59.1 kg)   LMP  (LMP Unknown)   BMI 20.40 kg/m  Wt Readings from Last 3 Encounters:  06/01/23 130 lb 4 oz (59.1 kg)  12/21/22 130 lb 3.2 oz (59.1 kg)  09/03/21 125 lb 9.6 oz (57 kg)   Constitutional:  Well-developed, in no acute distress. Psychiatric: Normal mood and affect. Behavior is normal. HEENT: Pupils normal.  Conjunctivae are normal. No scleral icterus. Cardiovascular: Normal rate, regular rhythm. No edema Pulmonary/chest: Effort normal and breath sounds normal. No wheezing, rales or rhonchi. Abdominal: Soft, nondistended. Nontender. Bowel sounds active throughout. There are no masses palpable. No hepatomegaly. Rectal: Deferred Neurological: Alert and oriented to person place and  time. Skin: Skin is warm and dry. No rashes noted.      Edman Circle, MD 06/01/2023, 9:24 AM  Cc: Buckner Malta, MD

## 2023-06-01 NOTE — Patient Instructions (Addendum)
_______________________________________________________  If your blood pressure at your visit was 140/90 or greater, please contact your primary care physician to follow up on this.  _______________________________________________________  If you are age 57 or older, your body mass index should be between 23-30. Your Body mass index is 20.4 kg/m. If this is out of the aforementioned range listed, please consider follow up with your Primary Care Provider.  If you are age 56 or younger, your body mass index should be between 19-25. Your Body mass index is 20.4 kg/m. If this is out of the aformentioned range listed, please consider follow up with your Primary Care Provider.   ________________________________________________________  The [facility] providers would like to encourage you to use [facility] to communicate with providers for non-urgent requests or questions.  Due to long hold times on the telephone, sending your provider a message by [facility] may be a faster and more efficient way to get a response.  Please allow 48 business hours for a response.  Please remember that this is for non-urgent requests.  _______________________________________________________  Continue omperazole 40mg  in the morning and pepcid 20mg  at night  You have been given an Ultrasound form for you to have your ultrasound done at [facility]. Please call them at [phone] option [phone] to schedule. I did fax order but you have been given the order.  You have been scheduled for an endoscopy. Please follow written instructions given to you at your visit today.  If you use inhalers (even only as needed), please bring them with you on the day of your procedure.  If you take any of the following medications, they will need to be adjusted prior to your procedure:   DO NOT TAKE 7 DAYS PRIOR TO TEST- Trulicity (dulaglutide) Ozempic, Wegovy (semaglutide) Mounjaro (tirzepatide) Bydureon Bcise (exanatide extended release)  DO NOT  TAKE 1 DAY PRIOR TO YOUR TEST Rybelsus (semaglutide) Adlyxin (lixisenatide) Victoza (liraglutide) Byetta (exanatide) ___________________________________________________________________________  Thank you,  Dr. Lynann Bologna

## 2023-06-03 DIAGNOSIS — R1013 Epigastric pain: Secondary | ICD-10-CM | POA: Diagnosis not present

## 2023-06-03 DIAGNOSIS — K219 Gastro-esophageal reflux disease without esophagitis: Secondary | ICD-10-CM | POA: Diagnosis not present

## 2023-06-03 DIAGNOSIS — K838 Other specified diseases of biliary tract: Secondary | ICD-10-CM | POA: Diagnosis not present

## 2023-06-22 ENCOUNTER — Telehealth: Payer: Self-pay

## 2023-06-22 NOTE — Telephone Encounter (Signed)
Patient made aware labs and [place] looks normal and she voiced understanding

## 2023-07-08 ENCOUNTER — Encounter: Payer: Self-pay | Admitting: Gastroenterology

## 2023-07-12 ENCOUNTER — Encounter: Payer: Self-pay | Admitting: Gastroenterology

## 2023-07-13 DIAGNOSIS — B351 Tinea unguium: Secondary | ICD-10-CM | POA: Diagnosis not present

## 2023-07-13 DIAGNOSIS — L608 Other nail disorders: Secondary | ICD-10-CM | POA: Diagnosis not present

## 2023-07-18 ENCOUNTER — Encounter: Payer: Self-pay | Admitting: Certified Registered Nurse Anesthetist

## 2023-07-19 DIAGNOSIS — R5383 Other fatigue: Secondary | ICD-10-CM | POA: Diagnosis not present

## 2023-07-19 DIAGNOSIS — Z79899 Other long term (current) drug therapy: Secondary | ICD-10-CM | POA: Diagnosis not present

## 2023-07-19 DIAGNOSIS — N951 Menopausal and female climacteric states: Secondary | ICD-10-CM | POA: Diagnosis not present

## 2023-07-19 DIAGNOSIS — N952 Postmenopausal atrophic vaginitis: Secondary | ICD-10-CM | POA: Diagnosis not present

## 2023-07-26 ENCOUNTER — Ambulatory Visit: Payer: BC Managed Care – PPO | Admitting: Gastroenterology

## 2023-07-26 ENCOUNTER — Encounter: Payer: Self-pay | Admitting: Gastroenterology

## 2023-07-26 VITALS — BP 122/74 | HR 77 | Temp 98.4°F | Resp 16 | Ht 67.0 in | Wt 130.0 lb

## 2023-07-26 DIAGNOSIS — K317 Polyp of stomach and duodenum: Secondary | ICD-10-CM

## 2023-07-26 DIAGNOSIS — K219 Gastro-esophageal reflux disease without esophagitis: Secondary | ICD-10-CM | POA: Diagnosis not present

## 2023-07-26 DIAGNOSIS — R1013 Epigastric pain: Secondary | ICD-10-CM

## 2023-07-26 MED ORDER — SODIUM CHLORIDE 0.9 % IV SOLN: 500.0000 mL | Freq: Once | INTRAVENOUS | Status: DC

## 2023-07-26 MED ORDER — OMEPRAZOLE 20 MG PO CPDR
20.0000 mg | DELAYED_RELEASE_CAPSULE | Freq: Every day | ORAL | 3 refills | Status: DC
Start: 2023-07-26 — End: 2024-03-31

## 2023-07-26 NOTE — Progress Notes (Signed)
Per Dr. Chales Abrahams- Omeprazole 20 mg po daily, #30, #3 refills

## 2023-07-26 NOTE — Progress Notes (Signed)
0919 Robinul 0.1 mg IV given due large amount of secretions upon assessment.  MD made aware, vss

## 2023-07-26 NOTE — Progress Notes (Signed)
Chief Complaint: GI workup  Referring Provider:  Buckner Malta, MD      ASSESSMENT AND PLAN;   #1. Refractory GERD  #2. Epi pain. S/P lap chole in past  Plan: -EGD -[place] abdo -Lab test results from Dr York Grice -Conitnue omeprazole 40mg  QAM, add pepcid 20 at bedtime -Obtain liver biopsy report from [facility] (from 2012, if possible)   HPI:    Cathy Whitaker is a 57 y.o. female  Accompanied by her husband With H/O allergic rhinitis (followed by Dr. Lucie Leather), laryngopharyngeal reflux (Dr Lucie Leather, Dr. Annalee Genta), migraines  C/O postprandial epi pain with reflux x 6 mts with postprandial bloating Has been better ever since she is taking omeprazole 40 mg p.o. daily Still with nocturnal symptoms Denies having any odynophagia or dysphagia Has been advised to get EGD No definite lactose or gluten intolerance by history  No significant nonsteroidals. She does drink 1 Dr. Reino Kent per day, some chocolates but no meds.  She denies having any diarrhea except when she eats out specially with salads-ever since she had cholecystectomy.  No constipation.  Her hoarseness is better since she has been on omeprazole  Status post lap chole with negative IOC by Dr. Georgiana Shore 2012.  She also had liver biopsy at that time for abnormal LFTs which showed fatty liver.  Her most recent labs including LFTs were normal.  No alcohol.  No weight gain.   Past GI WU: (Dr Charm Barges) -Colon Feb 2023: neg. Rpt 10 yrs -Colon 2019: Diminutive polyps.  CT AP 04/2011: Periportal edema.  Low-density lesion in the right hepatic lobe which is incompletely characterized.  Lap chole with IOC 05/29/2011 by Dr. Georgiana Shore for chronic cholecystitis with biliary dyskinesia.  She also had liver biopsy at that time showing fatty liver.  The actual liver biopsy report has been requested.  SH-married, accompanied by her husband.  No children.  Works in [employer] Past Medical History:  Diagnosis Date   Allergic  rhinitis    LPRD (laryngopharyngeal reflux disease)    Migraines     Past Surgical History:  Procedure Laterality Date   CHOLECYSTECTOMY  05/2011   NASAL SINUS SURGERY  11/2020    Family History  Problem Relation Age of Onset   Heart attack Mother    Hypertension Mother    Hyperlipidemia Mother    Arthritis Mother    AAA (abdominal aortic aneurysm) Mother    Emphysema Father    Stroke Father    Diabetes Sister    Breast cancer Sister    Heart Problems Brother    Diabetes Maternal Grandmother    Cancer Maternal Grandmother        in her back   Alzheimer's disease Maternal Grandmother    Heart attack Maternal Grandfather    Stroke Paternal Grandmother     Social History   Tobacco Use   Smoking status: Never   Smokeless tobacco: Never  Vaping Use   Vaping status: Never Used  Substance Use Topics   Alcohol use: Not Currently   Drug use: Never    Current Outpatient Medications  Medication Sig Dispense Refill   CALCIUM-MAGNESIUM-ZINC PO Take by mouth daily.     Cholecalciferol (VITAMIN D3 PO) Take 50 mcg by mouth daily.     Cyanocobalamin (VITAMIN B-12 PO) Take 1 tablet by mouth daily.     cyclobenzaprine (FLEXERIL) 5 MG tablet Take 5 mg by mouth as needed. (Patient not taking: Reported on 06/01/2023)     diclofenac (VOLTAREN) 75  MG EC tablet Take 75 mg by mouth 2 (two) times daily as needed.     estradiol (ESTRACE) 0.1 MG/GM vaginal cream Place vaginally.     famotidine (PEPCID) 20 MG tablet Take 20 mg by mouth 2 (two) times daily.     famotidine (PEPCID) 20 MG tablet Take 1 tablet (20 mg total) by mouth at bedtime. 90 tablet 4   fluticasone (FLONASE) 50 MCG/ACT nasal spray Place 1 spray into both nostrils daily.     gabapentin (NEURONTIN) 100 MG capsule Take 100 mg by mouth as needed. (Patient not taking: Reported on 06/01/2023)     loratadine (CLARITIN) 10 MG tablet Take 10 mg by mouth daily.     MILK THISTLE PO Take by mouth daily.     omeprazole (PRILOSEC) 40 MG  capsule Take one capsule by mouth one to two times daily as directed. 180 capsule 3   promethazine (PHENERGAN) 25 MG tablet Take 25 mg by mouth every 8 (eight) hours as needed.     SUMAtriptan (IMITREX) 100 MG tablet Take 100 mg by mouth as needed.  12   valACYclovir (VALTREX) 500 MG tablet Take 500 mg by mouth as needed. (Patient not taking: Reported on 06/01/2023)     VITAMIN E PO Take 400 Units by mouth daily.     No current facility-administered medications for this visit.    No Known Allergies  Review of Systems:  Constitutional: Denies fever, chills, diaphoresis, appetite change and fatigue.  HEENT: Had allergic rhinitis Cardiovascular: Denies chest pain, palpitations and leg swelling.  Genitourinary: Denies dysuria, urgency, frequency, hematuria, flank pain and difficulty urinating.  Musculoskeletal: Denies myalgias, back pain, joint swelling, arthralgias and gait problem.  Skin: No rash.  Neurological: Denies dizziness, seizures, syncope, weakness, light-headedness, numbness and has headaches.  Hematological: Denies adenopathy. Easy bruising, personal or family bleeding history  Psychiatric/Behavioral: No anxiety or depression     Physical Exam:    LMP  (LMP Unknown)  Wt Readings from Last 3 Encounters:  06/01/23 130 lb 4 oz (59.1 kg)  12/21/22 130 lb 3.2 oz (59.1 kg)  09/03/21 125 lb 9.6 oz (57 kg)   Constitutional:  Well-developed, in no acute distress. Psychiatric: Normal mood and affect. Behavior is normal. HEENT: Pupils normal.  Conjunctivae are normal. No scleral icterus. Cardiovascular: Normal rate, regular rhythm. No edema Pulmonary/chest: Effort normal and breath sounds normal. No wheezing, rales or rhonchi. Abdominal: Soft, nondistended. Nontender. Bowel sounds active throughout. There are no masses palpable. No hepatomegaly. Rectal: Deferred Neurological: Alert and oriented to person place and time. Skin: Skin is warm and dry. No rashes noted.      Edman Circle, MD 07/26/2023, 9:03 AM  Cc: Buckner Malta, MD

## 2023-07-26 NOTE — Op Note (Signed)
[facility] Patient Name: Cathy Whitaker Procedure Date: 07/26/2023 9:19 AM MRN: [id number] Endoscopist: Lynann Bologna , MD, [id number] Age: 57 Referring MD:  Date of Birth: 15-Dec-1965 Gender: Female Account #: [id number] Procedure:                Upper GI endoscopy Indications:              Epigastric abdominal pain with negative ultrasound                            Abdo. History of cholecystectomy. Medicines:                Monitored Anesthesia Care Procedure:                Pre-Anesthesia Assessment:                           - Prior to the procedure, a History and Physical                            was performed, and patient medications and                            allergies were reviewed. The patient's tolerance of                            previous anesthesia was also reviewed. The risks                            and benefits of the procedure and the sedation                            options and risks were discussed with the patient.                            All questions were answered, and informed consent                            was obtained. Prior Anticoagulants: The patient has                            taken no anticoagulant or antiplatelet agents. ASA                            Grade Assessment: II - A patient with mild systemic                            disease. After reviewing the risks and benefits,                            the patient was deemed in satisfactory condition to                            undergo the procedure.  After obtaining informed consent, the endoscope was                            passed under direct vision. Throughout the                            procedure, the patient's blood pressure, pulse, and                            oxygen saturations were monitored continuously. The                            Olympus Scope [id number] was introduced through the                            mouth, and advanced  to the second part of duodenum.                            The upper GI endoscopy was accomplished without                            difficulty. The patient tolerated the procedure                            well. Scope In: Scope Out: Findings:                 The examined esophagus was normal. Biopsies were                            obtained from the proximal and distal esophagus                            with cold forceps for histology of suspected                            eosinophilic esophagitis.                           The Z-line was regular and was found 38 cm from the                            incisors. Examined by NBI.                           The gastric mucosa was normal. Few biopsies were                            obtained from the antrum and body of the stomach to                            rule out H. pylori. A few (6-8) 2 to 3 mm sessile  polyps with no bleeding and no stigmata of recent                            bleeding were found in the gastric fundus and in                            the gastric body. Three polyps were removed with a                            cold biopsy forceps. Resection and retrieval were                            complete.                           The examined duodenum was normal. Biopsies for                            histology were taken with a cold forceps for                            evaluation of celiac disease. Complications:            No immediate complications. Estimated Blood Loss:     Estimated blood loss: none. Impression:               - A few gastric polyps. Resected and retrieved x 3.                           - Otherwise normal EGD. Recommendation:           - Patient has a contact number available for                            emergencies. The signs and symptoms of potential                            delayed complications were discussed with the                            patient. Return  to normal activities tomorrow.                            Written discharge instructions were provided to the                            patient.                           - Resume previous diet.                           - Reduce omeprazole to 20 mg p.o. every morning.                            Can continue Pepcid  20 mg p.o. nightly for now.                            After 4 weeks, then stop Pepcid                           - Await pathology results.                           - FU if still with problems.                           - The findings and recommendations were discussed                            with the patient's family. Lynann Bologna, MD 07/26/2023 9:36:10 AM This report has been signed electronically.

## 2023-07-26 NOTE — Patient Instructions (Addendum)
Reduce omeprazole to 20 mg p.o. every morning.  Can continue Pepcid 20 mg p.o. nightly for now.  After 4 weeks, then stop Pepcid Await pathology results. Follow up if still with problems.  YOU HAD AN ENDOSCOPIC PROCEDURE TODAY AT THE [facility]:   Refer to the procedure report that was given to you for any specific questions about what was found during the examination.  If the procedure report does not answer your questions, please call your gastroenterologist to clarify.  If you requested that your care partner not be given the details of your procedure findings, then the procedure report has been included in a sealed envelope for you to review at your convenience later.  YOU SHOULD EXPECT: Some feelings of bloating in the abdomen. Passage of more gas than usual.  Walking can help get rid of the air that was put into your GI tract during the procedure and reduce the bloating.  Please Note:  You might notice some irritation and congestion in your nose or some drainage.  This is from the oxygen used during your procedure.  There is no need for concern and it should clear up in a day or so.  SYMPTOMS TO REPORT IMMEDIATELY: Following upper endoscopy (EGD)  Vomiting of blood or coffee ground material  New chest pain or pain under the shoulder blades  Painful or persistently difficult swallowing  New shortness of breath  Fever of 100F or higher  Black, tarry-looking stools  For urgent or emergent issues, a gastroenterologist can be reached at any hour by calling [phone]. Do not use MyChart messaging for urgent concerns.    DIET:  We do recommend a small meal at first, but then you may proceed to your regular diet.  Drink plenty of fluids but you should avoid alcoholic beverages for 24 hours.  ACTIVITY:  You should plan to take it easy for the rest of today and you should NOT DRIVE or use heavy machinery until tomorrow (because of the sedation medicines used during the  test).    FOLLOW UP: Our staff will call the number listed on your records the next business day following your procedure.  We will call around 7:15- 8:00 am to check on you and address any questions or concerns that you may have regarding the information given to you following your procedure. If we do not reach you, we will leave a message.     If any biopsies were taken you will be contacted by phone or by letter within the next 1-3 weeks.  Please call us at [phone] if you have not heard about the biopsies in 3 weeks.    SIGNATURES/CONFIDENTIALITY: You and/or your care partner have signed paperwork which will be entered into your electronic medical record.  These signatures attest to the fact that that the information above on your After Visit Summary has been reviewed and is understood.  Full responsibility of the confidentiality of this discharge information lies with you and/or your care-partner.

## 2023-07-26 NOTE — Progress Notes (Signed)
Report given to PACU, vss 

## 2023-07-26 NOTE — Progress Notes (Signed)
Called to room to assist during endoscopic procedure.  Patient ID and intended procedure confirmed with present staff. Received instructions for my participation in the procedure from the performing physician.  

## 2023-07-27 ENCOUNTER — Telehealth: Payer: Self-pay

## 2023-07-27 NOTE — Telephone Encounter (Signed)
  Follow up Call-     07/26/2023    9:08 AM  Call back number  Post procedure Call Back phone  # [phone]  Permission to leave phone message Yes     Patient questions:  Do you have a fever, pain , or abdominal swelling? No. Pain Score  0 *  Have you tolerated food without any problems? Yes.    Have you been able to return to your normal activities? Yes.    Do you have any questions about your discharge instructions: Diet   No. Medications  No. Follow up visit  No.  Do you have questions or concerns about your Care? No.  Actions: * If pain score is 4 or above: No action needed, pain <4.

## 2023-07-28 LAB — SURGICAL PATHOLOGY

## 2023-07-29 ENCOUNTER — Encounter: Payer: Self-pay | Admitting: Gastroenterology

## 2023-09-15 DIAGNOSIS — L578 Other skin changes due to chronic exposure to nonionizing radiation: Secondary | ICD-10-CM | POA: Diagnosis not present

## 2023-09-15 DIAGNOSIS — L814 Other melanin hyperpigmentation: Secondary | ICD-10-CM | POA: Diagnosis not present

## 2023-09-15 DIAGNOSIS — D2239 Melanocytic nevi of other parts of face: Secondary | ICD-10-CM | POA: Diagnosis not present

## 2023-09-15 DIAGNOSIS — D225 Melanocytic nevi of trunk: Secondary | ICD-10-CM | POA: Diagnosis not present

## 2023-09-30 DIAGNOSIS — M41125 Adolescent idiopathic scoliosis, thoracolumbar region: Secondary | ICD-10-CM | POA: Diagnosis not present

## 2023-09-30 DIAGNOSIS — M5416 Radiculopathy, lumbar region: Secondary | ICD-10-CM | POA: Diagnosis not present

## 2023-09-30 DIAGNOSIS — M5451 Vertebrogenic low back pain: Secondary | ICD-10-CM | POA: Diagnosis not present

## 2023-09-30 DIAGNOSIS — M546 Pain in thoracic spine: Secondary | ICD-10-CM | POA: Diagnosis not present

## 2023-10-19 DIAGNOSIS — Z1231 Encounter for screening mammogram for malignant neoplasm of breast: Secondary | ICD-10-CM | POA: Diagnosis not present

## 2023-10-19 DIAGNOSIS — R350 Frequency of micturition: Secondary | ICD-10-CM | POA: Diagnosis not present

## 2023-10-19 DIAGNOSIS — Z01419 Encounter for gynecological examination (general) (routine) without abnormal findings: Secondary | ICD-10-CM | POA: Diagnosis not present

## 2023-11-11 DIAGNOSIS — J0100 Acute maxillary sinusitis, unspecified: Secondary | ICD-10-CM | POA: Diagnosis not present

## 2023-11-13 DIAGNOSIS — R051 Acute cough: Secondary | ICD-10-CM | POA: Diagnosis not present

## 2023-11-13 DIAGNOSIS — R0981 Nasal congestion: Secondary | ICD-10-CM | POA: Diagnosis not present

## 2023-11-13 DIAGNOSIS — H6692 Otitis media, unspecified, left ear: Secondary | ICD-10-CM | POA: Diagnosis not present

## 2023-11-13 DIAGNOSIS — J0100 Acute maxillary sinusitis, unspecified: Secondary | ICD-10-CM | POA: Diagnosis not present

## 2023-11-19 DIAGNOSIS — R051 Acute cough: Secondary | ICD-10-CM | POA: Diagnosis not present

## 2023-11-19 DIAGNOSIS — R0981 Nasal congestion: Secondary | ICD-10-CM | POA: Diagnosis not present

## 2023-11-19 DIAGNOSIS — R0602 Shortness of breath: Secondary | ICD-10-CM | POA: Diagnosis not present

## 2024-03-27 DIAGNOSIS — J329 Chronic sinusitis, unspecified: Secondary | ICD-10-CM | POA: Diagnosis not present

## 2024-03-27 DIAGNOSIS — J019 Acute sinusitis, unspecified: Secondary | ICD-10-CM | POA: Diagnosis not present

## 2024-03-27 DIAGNOSIS — B9689 Other specified bacterial agents as the cause of diseases classified elsewhere: Secondary | ICD-10-CM | POA: Diagnosis not present

## 2024-03-27 DIAGNOSIS — H65191 Other acute nonsuppurative otitis media, right ear: Secondary | ICD-10-CM | POA: Diagnosis not present

## 2024-03-31 ENCOUNTER — Other Ambulatory Visit: Payer: Self-pay | Admitting: Gastroenterology

## 2024-03-31 DIAGNOSIS — K219 Gastro-esophageal reflux disease without esophagitis: Secondary | ICD-10-CM

## 2024-04-16 DIAGNOSIS — R051 Acute cough: Secondary | ICD-10-CM | POA: Diagnosis not present

## 2024-04-16 DIAGNOSIS — R0981 Nasal congestion: Secondary | ICD-10-CM | POA: Diagnosis not present

## 2024-04-16 DIAGNOSIS — J3489 Other specified disorders of nose and nasal sinuses: Secondary | ICD-10-CM | POA: Diagnosis not present

## 2024-04-16 DIAGNOSIS — R509 Fever, unspecified: Secondary | ICD-10-CM | POA: Diagnosis not present

## 2024-05-11 DIAGNOSIS — M79641 Pain in right hand: Secondary | ICD-10-CM | POA: Diagnosis not present

## 2024-05-11 DIAGNOSIS — Z6820 Body mass index (BMI) 20.0-20.9, adult: Secondary | ICD-10-CM | POA: Diagnosis not present

## 2024-05-29 DIAGNOSIS — Z780 Asymptomatic menopausal state: Secondary | ICD-10-CM | POA: Diagnosis not present

## 2024-05-29 DIAGNOSIS — Z13220 Encounter for screening for lipoid disorders: Secondary | ICD-10-CM | POA: Diagnosis not present

## 2024-05-29 DIAGNOSIS — N941 Unspecified dyspareunia: Secondary | ICD-10-CM | POA: Diagnosis not present

## 2024-05-29 DIAGNOSIS — Z131 Encounter for screening for diabetes mellitus: Secondary | ICD-10-CM | POA: Diagnosis not present

## 2024-05-29 DIAGNOSIS — Z1331 Encounter for screening for depression: Secondary | ICD-10-CM | POA: Diagnosis not present

## 2024-05-29 DIAGNOSIS — Z0000 Encounter for general adult medical examination without abnormal findings: Secondary | ICD-10-CM | POA: Diagnosis not present

## 2024-05-29 DIAGNOSIS — Z1339 Encounter for screening examination for other mental health and behavioral disorders: Secondary | ICD-10-CM | POA: Diagnosis not present

## 2024-05-30 DIAGNOSIS — R946 Abnormal results of thyroid function studies: Secondary | ICD-10-CM | POA: Diagnosis not present

## 2024-05-31 DIAGNOSIS — M79644 Pain in right finger(s): Secondary | ICD-10-CM | POA: Diagnosis not present

## 2024-05-31 DIAGNOSIS — M19049 Primary osteoarthritis, unspecified hand: Secondary | ICD-10-CM | POA: Diagnosis not present

## 2024-09-19 DIAGNOSIS — S29012A Strain of muscle and tendon of back wall of thorax, initial encounter: Secondary | ICD-10-CM | POA: Diagnosis not present

## 2024-09-19 DIAGNOSIS — Z6820 Body mass index (BMI) 20.0-20.9, adult: Secondary | ICD-10-CM | POA: Diagnosis not present

## 2024-09-19 DIAGNOSIS — M542 Cervicalgia: Secondary | ICD-10-CM | POA: Diagnosis not present

## 2024-09-19 DIAGNOSIS — S46819A Strain of other muscles, fascia and tendons at shoulder and upper arm level, unspecified arm, initial encounter: Secondary | ICD-10-CM | POA: Diagnosis not present
